# Patient Record
Sex: Male | Born: 1955 | Race: White | Hispanic: No | Smoking: Current every day smoker
Health system: Southern US, Community
[De-identification: ages and names within clinical notes are randomized; demographics above are authoritative.]

## PROBLEM LIST (undated history)

## (undated) DIAGNOSIS — F329 Major depressive disorder, single episode, unspecified: Secondary | ICD-10-CM

## (undated) DIAGNOSIS — I10 Essential (primary) hypertension: Secondary | ICD-10-CM

## (undated) DIAGNOSIS — F32A Depression, unspecified: Secondary | ICD-10-CM

## (undated) DIAGNOSIS — F419 Anxiety disorder, unspecified: Secondary | ICD-10-CM

## (undated) HISTORY — PX: APPENDECTOMY: SHX54

---

## 2018-04-30 ENCOUNTER — Encounter: Payer: Self-pay | Admitting: Internal Medicine

## 2018-04-30 ENCOUNTER — Inpatient Hospital Stay
Admission: EM | Admit: 2018-04-30 | Discharge: 2018-05-01 | DRG: 202 | Disposition: A | Discharge status: Home / Self Care | Attending: Internal Medicine | Admitting: Internal Medicine

## 2018-04-30 ENCOUNTER — Inpatient Hospital Stay

## 2018-04-30 ENCOUNTER — Emergency Department

## 2018-04-30 ENCOUNTER — Other Ambulatory Visit: Payer: Self-pay

## 2018-04-30 DIAGNOSIS — Z791 Long term (current) use of non-steroidal anti-inflammatories (NSAID): Secondary | ICD-10-CM | POA: Diagnosis not present

## 2018-04-30 DIAGNOSIS — A419 Sepsis, unspecified organism: Secondary | ICD-10-CM | POA: Diagnosis present

## 2018-04-30 DIAGNOSIS — Z23 Encounter for immunization: Secondary | ICD-10-CM | POA: Diagnosis not present

## 2018-04-30 DIAGNOSIS — Z79899 Other long term (current) drug therapy: Secondary | ICD-10-CM | POA: Diagnosis not present

## 2018-04-30 DIAGNOSIS — Z716 Tobacco abuse counseling: Secondary | ICD-10-CM | POA: Diagnosis not present

## 2018-04-30 DIAGNOSIS — K760 Fatty (change of) liver, not elsewhere classified: Secondary | ICD-10-CM | POA: Diagnosis present

## 2018-04-30 DIAGNOSIS — R6520 Severe sepsis without septic shock: Secondary | ICD-10-CM

## 2018-04-30 DIAGNOSIS — G4700 Insomnia, unspecified: Secondary | ICD-10-CM | POA: Diagnosis not present

## 2018-04-30 DIAGNOSIS — E869 Volume depletion, unspecified: Secondary | ICD-10-CM | POA: Diagnosis present

## 2018-04-30 DIAGNOSIS — J209 Acute bronchitis, unspecified: Principal | ICD-10-CM | POA: Diagnosis present

## 2018-04-30 DIAGNOSIS — R739 Hyperglycemia, unspecified: Secondary | ICD-10-CM | POA: Diagnosis present

## 2018-04-30 DIAGNOSIS — I248 Other forms of acute ischemic heart disease: Secondary | ICD-10-CM | POA: Diagnosis present

## 2018-04-30 DIAGNOSIS — E878 Other disorders of electrolyte and fluid balance, not elsewhere classified: Secondary | ICD-10-CM | POA: Diagnosis present

## 2018-04-30 DIAGNOSIS — E876 Hypokalemia: Secondary | ICD-10-CM | POA: Diagnosis not present

## 2018-04-30 DIAGNOSIS — J189 Pneumonia, unspecified organism: Secondary | ICD-10-CM

## 2018-04-30 DIAGNOSIS — F17210 Nicotine dependence, cigarettes, uncomplicated: Secondary | ICD-10-CM | POA: Diagnosis not present

## 2018-04-30 DIAGNOSIS — G7281 Critical illness myopathy: Secondary | ICD-10-CM

## 2018-04-30 LAB — COMPREHENSIVE METABOLIC PANEL
ALT: 60 U/L — ABNORMAL HIGH (ref 0–44)
AST: 46 U/L — ABNORMAL HIGH (ref 15–41)
Albumin: 4 g/dL (ref 3.5–5.0)
Alkaline Phosphatase: 61 U/L (ref 38–126)
Anion gap: 9 (ref 5–15)
BUN: 24 mg/dL — ABNORMAL HIGH (ref 6–20)
CO2: 20 mmol/L — ABNORMAL LOW (ref 22–32)
Calcium: 8.9 mg/dL (ref 8.9–10.3)
Chloride: 108 mmol/L (ref 98–111)
Creatinine, Ser: 0.84 mg/dL (ref 0.61–1.24)
GFR calc Af Amer: >60 mL/min (ref >60)
GFR calc non Af Amer: >60 mL/min (ref >60)
Glucose, Bld: 147 mg/dL — ABNORMAL HIGH (ref 70–99)
Potassium: 3.1 mmol/L — ABNORMAL LOW (ref 3.5–5.1)
SODIUM: 137 mmol/L (ref 135–145)
Total Bilirubin: 1 mg/dL (ref 0.3–1.2)
Total Protein: 6.6 g/dL (ref 6.5–8.1)

## 2018-04-30 LAB — CBC WITH DIFFERENTIAL/PLATELET
Abs Immature Granulocytes: 0.01 10*3/uL (ref 0.00–0.07)
Basophils Absolute: 0 10*3/uL (ref 0.0–0.1)
Basophils Relative: 1 %
Eosinophils Absolute: 0 10*3/uL (ref 0.0–0.5)
Eosinophils Relative: 0 %
HCT: 40.9 % (ref 39.0–52.0)
HEMOGLOBIN: 14.3 g/dL (ref 13.0–17.0)
Immature Granulocytes: 0 %
Lymphocytes Relative: 9 %
Lymphs Abs: 0.3 10*3/uL — ABNORMAL LOW (ref 0.7–4.0)
MCH: 30.2 pg (ref 26.0–34.0)
MCHC: 35 g/dL (ref 30.0–36.0)
MCV: 86.5 fL (ref 80.0–100.0)
Monocytes Absolute: 0 10*3/uL — ABNORMAL LOW (ref 0.1–1.0)
Monocytes Relative: 1 %
Neutro Abs: 3 10*3/uL (ref 1.7–7.7)
Neutrophils Relative %: 89 %
Platelets: 153 10*3/uL (ref 150–400)
RBC: 4.73 MIL/uL (ref 4.22–5.81)
RDW: 14.2 % (ref 11.5–15.5)
WBC: 3.4 10*3/uL — ABNORMAL LOW (ref 4.0–10.5)
nRBC: 0 % (ref 0.0–0.2)

## 2018-04-30 LAB — INFLUENZA PANEL BY PCR (TYPE A & B)
INFLBPCR: NEGATIVE
Influenza A By PCR: NEGATIVE

## 2018-04-30 LAB — LACTIC ACID, PLASMA: Lactic Acid, Venous: 1.6 mmol/L (ref 0.5–1.9)

## 2018-04-30 LAB — URINALYSIS, COMPLETE (UACMP) WITH MICROSCOPIC
Bacteria, UA: NONE SEEN
Bilirubin Urine: NEGATIVE
Glucose, UA: NEGATIVE mg/dL
Ketones, ur: 5 mg/dL — AB
Leukocytes, UA: NEGATIVE
Nitrite: NEGATIVE
Protein, ur: 100 mg/dL — AB
SPECIFIC GRAVITY, URINE: 1.021 (ref 1.005–1.030)
pH: 5 (ref 5.0–8.0)

## 2018-04-30 LAB — TROPONIN I
Troponin I: 0.13 ng/mL (ref <0.03)
Troponin I: 0.62 ng/mL (ref <0.03)
Troponin I: 0.59 ng/mL (ref <0.03)
Troponin I: 0.54 ng/mL (ref <0.03)
Troponin I: 0.39 ng/mL (ref <0.03)

## 2018-04-30 LAB — PHOSPHORUS: Phosphorus: 1.3 mg/dL — ABNORMAL LOW (ref 2.5–4.6)

## 2018-04-30 LAB — PROTIME-INR
INR: 1.21
PROTHROMBIN TIME: 15.2 s (ref 11.4–15.2)

## 2018-04-30 LAB — MAGNESIUM: Magnesium: 1.6 mg/dL — ABNORMAL LOW (ref 1.7–2.4)

## 2018-04-30 LAB — PROCALCITONIN: Procalcitonin: 20.57 ng/mL

## 2018-04-30 MED ORDER — MORPHINE SULFATE (PF) 4 MG/ML IV SOLN
4.0000 mg | INTRAVENOUS | Status: DC | PRN
  Administered 2018-04-30: 4 mg via INTRAVENOUS
  Filled 2018-04-30: qty 1

## 2018-04-30 MED ORDER — IBUPROFEN 600 MG PO TABS: 600.0000 mg | ORAL_TABLET | Freq: Once | ORAL | Status: DC

## 2018-04-30 MED ORDER — POTASSIUM CHLORIDE CRYS ER 20 MEQ PO TBCR
EXTENDED_RELEASE_TABLET | ORAL | Status: AC
  Filled 2018-04-30: qty 2

## 2018-04-30 MED ORDER — POTASSIUM CHLORIDE CRYS ER 20 MEQ PO TBCR
80.0000 meq | EXTENDED_RELEASE_TABLET | Freq: Once | ORAL | Status: AC
  Administered 2018-04-30: 80 meq via ORAL
  Filled 2018-04-30: qty 4

## 2018-04-30 MED ORDER — MAGNESIUM SULFATE 2 GM/50ML IV SOLN
2.0000 g | Freq: Once | INTRAVENOUS | Status: AC
  Administered 2018-04-30: 2 g via INTRAVENOUS
  Filled 2018-04-30: qty 50

## 2018-04-30 MED ORDER — LACTATED RINGERS IV SOLN
INTRAVENOUS | Status: AC
  Administered 2018-04-30 (×2): via INTRAVENOUS

## 2018-04-30 MED ORDER — MAGNESIUM SULFATE 2 GM/50ML IV SOLN
2.0000 g | Freq: Once | INTRAVENOUS | Status: AC
  Administered 2018-04-30: 2 g via INTRAVENOUS

## 2018-04-30 MED ORDER — POTASSIUM CHLORIDE CRYS ER 20 MEQ PO TBCR: 40.0000 meq | EXTENDED_RELEASE_TABLET | Freq: Once | ORAL | Status: DC

## 2018-04-30 MED ORDER — VANCOMYCIN HCL IN DEXTROSE 1-5 GM/200ML-% IV SOLN
1000.0000 mg | Freq: Two times a day (BID) | INTRAVENOUS | Status: DC
  Filled 2018-04-30: qty 200

## 2018-04-30 MED ORDER — ONDANSETRON HCL 4 MG/2ML IJ SOLN: 4.0000 mg | Freq: Four times a day (QID) | INTRAMUSCULAR | Status: DC | PRN

## 2018-04-30 MED ORDER — VANCOMYCIN HCL IN DEXTROSE 1-5 GM/200ML-% IV SOLN
1000.0000 mg | Freq: Once | INTRAVENOUS | Status: AC
  Administered 2018-04-30: 1000 mg via INTRAVENOUS
  Filled 2018-04-30 (×2): qty 200

## 2018-04-30 MED ORDER — ACETAMINOPHEN 500 MG PO TABS
1000.0000 mg | ORAL_TABLET | Freq: Once | ORAL | Status: AC
  Administered 2018-04-30: 1000 mg via ORAL
  Filled 2018-04-30: qty 2

## 2018-04-30 MED ORDER — BISACODYL 5 MG PO TBEC: 5.0000 mg | DELAYED_RELEASE_TABLET | Freq: Every day | ORAL | Status: DC | PRN

## 2018-04-30 MED ORDER — MORPHINE SULFATE (PF) 2 MG/ML IV SOLN
2.0000 mg | INTRAVENOUS | Status: DC | PRN
  Administered 2018-04-30 (×2): 2 mg via INTRAVENOUS
  Filled 2018-04-30 (×2): qty 1

## 2018-04-30 MED ORDER — SODIUM CHLORIDE 0.9 % IV BOLUS
1000.0000 mL | Freq: Once | INTRAVENOUS | Status: AC
  Administered 2018-04-30: 1000 mL via INTRAVENOUS

## 2018-04-30 MED ORDER — MAGNESIUM SULFATE 2 GM/50ML IV SOLN
2.0000 g | Freq: Once | INTRAVENOUS | Status: DC
  Filled 2018-04-30: qty 50

## 2018-04-30 MED ORDER — VANCOMYCIN HCL 10 G IV SOLR
1250.0000 mg | Freq: Two times a day (BID) | INTRAVENOUS | Status: DC
  Filled 2018-04-30 (×2): qty 1250

## 2018-04-30 MED ORDER — POTASSIUM PHOSPHATES 15 MMOLE/5ML IV SOLN
20.0000 mmol | Freq: Once | INTRAVENOUS | Status: AC
  Administered 2018-04-30: 20 mmol via INTRAVENOUS
  Filled 2018-04-30: qty 6.67

## 2018-04-30 MED ORDER — ACETAMINOPHEN 325 MG PO TABS
650.0000 mg | ORAL_TABLET | Freq: Four times a day (QID) | ORAL | Status: DC | PRN
  Administered 2018-04-30: 650 mg via ORAL
  Filled 2018-04-30: qty 2

## 2018-04-30 MED ORDER — METRONIDAZOLE IN NACL 5-0.79 MG/ML-% IV SOLN
500.0000 mg | Freq: Three times a day (TID) | INTRAVENOUS | Status: DC
  Administered 2018-04-30 – 2018-05-01 (×4): 500 mg via INTRAVENOUS
  Filled 2018-04-30 (×7): qty 100

## 2018-04-30 MED ORDER — LACTATED RINGERS IV BOLUS
1000.0000 mL | Freq: Once | INTRAVENOUS | Status: AC
  Administered 2018-04-30: 1000 mL via INTRAVENOUS

## 2018-04-30 MED ORDER — SODIUM CHLORIDE 0.9 % IV SOLN
2.0000 g | Freq: Three times a day (TID) | INTRAVENOUS | Status: DC
  Administered 2018-04-30 – 2018-05-01 (×3): 2 g via INTRAVENOUS
  Filled 2018-04-30 (×6): qty 2

## 2018-04-30 MED ORDER — ZOLPIDEM TARTRATE 5 MG PO TABS
5.0000 mg | ORAL_TABLET | Freq: Every evening | ORAL | Status: DC | PRN
  Administered 2018-04-30: 5 mg via ORAL
  Filled 2018-04-30: qty 1

## 2018-04-30 MED ORDER — ENOXAPARIN SODIUM 40 MG/0.4ML ~~LOC~~ SOLN
40.0000 mg | SUBCUTANEOUS | Status: DC
  Administered 2018-04-30 – 2018-05-01 (×2): 40 mg via SUBCUTANEOUS
  Filled 2018-04-30 (×2): qty 0.4

## 2018-04-30 MED ORDER — INFLUENZA VAC SPLIT QUAD 0.5 ML IM SUSY
0.5000 mL | PREFILLED_SYRINGE | INTRAMUSCULAR | Status: AC
  Administered 2018-05-01: 0.5 mL via INTRAMUSCULAR
  Filled 2018-04-30: qty 0.5

## 2018-04-30 MED ORDER — IBUPROFEN 400 MG PO TABS
800.0000 mg | ORAL_TABLET | Freq: Four times a day (QID) | ORAL | Status: DC | PRN
  Administered 2018-04-30 – 2018-05-01 (×3): 800 mg via ORAL
  Filled 2018-04-30 (×2): qty 2

## 2018-04-30 MED ORDER — ASPIRIN 81 MG PO CHEW
81.0000 mg | CHEWABLE_TABLET | Freq: Every day | ORAL | Status: DC
  Administered 2018-04-30 – 2018-05-01 (×2): 81 mg via ORAL
  Filled 2018-04-30 (×2): qty 1

## 2018-04-30 MED ORDER — IOHEXOL 300 MG/ML  SOLN
100.0000 mL | Freq: Once | INTRAMUSCULAR | Status: AC | PRN
  Administered 2018-04-30: 100 mL via INTRAVENOUS

## 2018-04-30 MED ORDER — SENNOSIDES-DOCUSATE SODIUM 8.6-50 MG PO TABS: 1.0000 | ORAL_TABLET | Freq: Every evening | ORAL | Status: DC | PRN

## 2018-04-30 MED ORDER — SODIUM CHLORIDE 0.9 % IV SOLN
2.0000 g | Freq: Once | INTRAVENOUS | Status: AC
  Administered 2018-04-30: 2 g via INTRAVENOUS
  Filled 2018-04-30: qty 2

## 2018-04-30 NOTE — H&P (Signed)
[employer] [place] [facility]   PATIENT NAME: Ricardo Roberts    MR#:  [id number]  DATE OF BIRTH:  May 09, 1956  DATE OF ADMISSION:  04/30/2018  PRIMARY CARE PHYSICIAN: Patient, No Pcp Per   REQUESTING/REFERRING PHYSICIAN: Merrily Brittle, MD  CHIEF COMPLAINT:   Chief Complaint  Patient presents with  . Fever  . Influenza    HISTORY OF PRESENT ILLNESS:  Ricardo Roberts  is a 62 y.o. male with a known history of tobacco use D/O p/w rigors, FUO, suspected sepsis. Pt is an excellent historian. He does not have a PCP and does not have any known medical history. He has been smoking 1ppd or less for ~66yrs. He does not take any prescription medications. He tells me that he had a cold/URI ~16mo ago, which lasted for ~1.5-2wks, and then improved. He states he had a persistent residual cough, but otherwise felt better, up until Friday 04/28/2018. He states he went to work as usual on Friday, and then went home in the afternoon, at which time he noticed body aches/myalgias throughout his body (most pronounced in his B/L thighs) and diffuse joint aches (most pronounced in fingers, knees, shoulders, neck). He states @~2300PM, he developed shaking chills. He endorses frontal headache and insomnia. He states his rigors stopped while he was in bed, but he developed subjective fever, diaphoresis and night sweats. He endorses fatigue/malaise and generalized weakness throughout the day on Saturday (04/29/2018). He states he again had rigors, subjective fever and diaphoresis @~2300PM. He states that his arms were shaking so badly that he was unable to take Advil out of the bottle without spilling it everywhere. He called EMS, and says his teeth were chattering, to the extent to which the EMS operator had difficulty obtaining his location/information. Pt endorses non-productive cough. He denies N/V/D, CP/SOB, AP. He endorses burning over the bladder and in the penis after he finished urinating, but  denies frequency, urgency, nocturia, hematuria, incontinence, hesitancy, dribbling, etc. He endorses multiple sick contacts (URI symptoms) at work. He denies recent travel. He has not been camping. 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 He did not receive flu or pneumonia vaccines. T 102.6 F, HR 136, RR 22, WBC 3.4, SIRS (+). BP stable, (-) shock. Lactate 1.6, however PCT 20.57. Flu (-). CXR (-). Presentation concerning for occult bacterial infxn, bacteremia. Infectious source not identified as yet. CT C/A/P pending. Started on broad-spectrum ABx (Cefepime + Vancomycin + Flagyl) in ED. Cx pending.  PAST MEDICAL HISTORY:  History reviewed. No pertinent past medical history.  PAST SURGICAL HISTORY:  History reviewed. No pertinent surgical history.  SOCIAL HISTORY:   Social History   Tobacco Use  . Smoking status: Not on file  Substance Use Topics  . Alcohol use: Not on file    FAMILY HISTORY:  History reviewed. No pertinent family history.  DRUG ALLERGIES:  No Known Allergies  REVIEW OF SYSTEMS:   Review of Systems  Constitutional: Positive for chills, diaphoresis, fever and malaise/fatigue. Negative for weight loss.  HENT: Negative for congestion, ear pain, hearing loss, nosebleeds, sinus pain, sore throat and tinnitus.   Eyes: Negative for blurred vision, double vision and photophobia.  Respiratory: Positive for cough. Negative for hemoptysis, sputum production, shortness of breath and wheezing.   Cardiovascular: Negative for chest pain, palpitations, orthopnea, claudication, leg swelling and PND.  Gastrointestinal: Negative for abdominal pain, blood in stool, constipation, diarrhea, heartburn, melena, nausea and vomiting.  Genitourinary: Negative for dysuria,  flank pain, frequency, hematuria and urgency.  Musculoskeletal: Positive for joint pain, myalgias and neck pain. Negative for back pain and falls.  Skin: Negative for itching and rash.    Neurological: Positive for weakness and headaches. Negative for dizziness, tingling, tremors, sensory change, speech change, focal weakness, seizures and loss of consciousness.  Psychiatric/Behavioral: Negative for depression and memory loss. The patient has insomnia. The patient is not nervous/anxious.    MEDICATIONS AT HOME:   Prior to Admission medications   Medication Sig Start Date End Date Taking? Authorizing Provider  acetaminophen (TYLENOL) 500 MG tablet Take 500 mg by mouth every 6 (six) hours as needed.   Yes [provider]  ibuprofen (ADVIL,MOTRIN) 200 MG tablet Take 600-800 mg by mouth every 6 (six) hours as needed.   Yes [provider]  Multiple Vitamin (MULTIVITAMIN WITH MINERALS) TABS tablet Take 1 tablet by mouth daily.   Yes [provider]      VITAL SIGNS:  Blood pressure 134/66, pulse (!) 136, temperature (!) 102.6 F (39.2 C), temperature source Oral, resp. rate (!) 22, height 5\' 9"  (1.753 m), weight 88.9 kg, SpO2 94 %.  PHYSICAL EXAMINATION:  Physical Exam  Constitutional: He is oriented to person, place, and time. He appears well-developed and well-nourished. He is active and cooperative. He appears toxic. No distress. He is not intubated.  HENT:  Head: Normocephalic and atraumatic.  Mouth/Throat: Oropharynx is clear and moist. No oropharyngeal exudate.  Eyes: Conjunctivae, EOM and lids are normal. No scleral icterus.  Neck: Neck supple. No JVD present. No thyromegaly present.  Cardiovascular: Regular rhythm, S1 normal, S2 normal and normal heart sounds.  No extrasystoles are present. Tachycardia present. Exam reveals no gallop, no S3, no S4, no distant heart sounds and no friction rub.  No murmur heard. Pulmonary/Chest: Effort normal. No accessory muscle usage or stridor. Tachypnea noted. No apnea and no bradypnea. He is not intubated. No respiratory distress. He has decreased breath sounds in the right upper field, the right middle  field, the right lower field, the left upper field, the left middle field and the left lower field. He has no wheezes. He has no rhonchi. He has rales in the right lower field and the left lower field.  Abdominal: Soft. He exhibits no distension. Bowel sounds are decreased. There is no tenderness. There is no rigidity, no rebound and no guarding.  Musculoskeletal: Normal range of motion. He exhibits no edema or tenderness.  Lymphadenopathy:    He has no cervical adenopathy.  Neurological: He is alert and oriented to person, place, and time. He is not disoriented.  Skin: Skin is warm and intact. No rash noted. He is diaphoretic. No erythema.  Psychiatric: He has a normal mood and affect. His speech is normal and behavior is normal. Judgment and thought content normal. Cognition and memory are normal.   Tachycardic, clammy/diaphoretic, toxic appearance. Bibasilar fine crackles. Abdomen soft, NT/ND. LABORATORY PANEL:   CBC Recent Labs  Lab 04/30/18 0102  WBC 3.4*  HGB 14.3  HCT 40.9  PLT 153   ------------------------------------------------------------------------------------------------------------------  Chemistries  Recent Labs  Lab 04/30/18 0102  NA 137  K 3.1*  CL 108  CO2 20*  GLUCOSE 147*  BUN 24*  CREATININE 0.84  CALCIUM 8.9  MG 1.6*  AST 46*  ALT 60*  ALKPHOS 61  BILITOT 1.0   ------------------------------------------------------------------------------------------------------------------  Cardiac Enzymes Recent Labs  Lab 04/30/18 0102  TROPONINI 0.13*   ------------------------------------------------------------------------------------------------------------------  RADIOLOGY:  [facility]  Result Date: 04/30/2018 CLINICAL DATA:  62 year old male with fever. EXAM: PORTABLE CHEST 1 VIEW COMPARISON:  None. FINDINGS: The heart size and mediastinal contours are within normal limits. Both lungs are clear. The visualized skeletal structures are  unremarkable. IMPRESSION: No active disease. Electronically Signed   By: Elgie Collard M.D.   On: 04/30/2018 01:17   IMPRESSION AND PLAN:   A/P: 42M rigors, FUO, SIRS (+), elevated PCT, suspected sepsis. Hypokalemia, hyperglycemia, insensible losses/intravascular volume depletion, hypophosphatemia, hypomagnesemia, transaminasemia, troponin elevation, leukopenia (w/o neutropenia). Myalgias, joint aches, headache, insomnia. -Rigors, FUO, SIRS (+), leukopenia (w/o neutropenia), elevated PCT, suspected sepsis: p/w cough, myalgias, joint aches, fever, chills, rigors, diaphoresis, night sweats, headache, insomnia, fatigue/malaise, generalized weakness (as per HPI). T 102.6 F, HR 136, RR 22, WBC 3.4 (ANC 3000); SIRS (+). BP stable, (-) shock. Lactate 1.6, however PCT 20.57. Flu (-). CXR (-). Presentation concerning for occult bacterial infxn, bacteremia. Infectious source not identified as yet. CT C/A/P pending. Started on broad-spectrum ABx (Cefepime + Vancomycin + Flagyl) in ED. IVF. BCx, sputum Cx/gram, UStrep + ULeg Ag pending. Tele, continuous cardiac monitoring, pulse ox. -Hypokalemia, hypomagnesemia, hypophosphatemia: Endorses good PO intake. K+ 3.1. Mag 1.6. Phos 1.3. Replete and monitor. -Insensible losses/intravascular volume depletion: Febrile, diaphoretic. BUN/Cr ratio 24/0.84 = ~28.6. IVF. -Transaminasemia: Likely 2/2 sepsis, intravascular volume depletion. (-) AP/N/V/D. Abdomen soft, NT/ND. Imaging pending. -Troponin elevation: Trop-I 0.13. Likely 2/2 tachycardia, demand in setting of sepsis. Trend. ZOX09. Tele, continuous cardiac monitoring. Denies CP/SOB. -Myalgias, joint aches, headache: Symptomatic mgmt, pain ctrl. -Insomnia: Ambien PRN. -No home meds -FEN/GI: Regular diet. -DVT PPx: Lovenox. -Code status: Full code. -Disposition: Admission, > 2 midnights.   All the records are reviewed and case discussed with ED provider. Management plans discussed with the patient, family and they  are in agreement.  CODE STATUS: Full code.  TOTAL TIME TAKING CARE OF THIS PATIENT: 75 minutes.    Barbaraann Rondo M.D on 04/30/2018 at 2:55 AM  Between 7am to 6pm - Pager - [phone]  After 6pm go to [email or web address] - Social research officer, government  [facility] [facility]  Office  [phone]  CC: Primary care physician; Patient, No Pcp Per   Note: This dictation was prepared with Dragon dictation along with smaller phrase technology. Any transcriptional errors that result from this process are unintentional.

## 2018-04-30 NOTE — Progress Notes (Signed)
Pharmacy Antibiotic Note  Ricardo FloridaMark Roberts is a 62 y.o. male admitted on 04/30/2018 with sepsis.  Pharmacy has been consulted for vanc/cefepime dosing. Patient received vanc 1g, cefepime 2g, and flagyl 500 mg IV x 1  Plan: Will continue w/ vanc 1.25g IV q12h w/ 6 hour stack  Will draw trough 12/09 @ 2300 prior to 4th dose. Will continue cefepime 2g IV q8h  Ke 0.0903 (overestimate) T1/2 calculated 8 hrs - will start off w/ q12h  Goal trough 15 - 20 mcg/mL  Height: (P) 5\' 9"  (175.3 cm) Weight: (P) 199 lb 11.2 oz (90.6 kg) IBW/kg (Calculated) : (P) 70.7  Temp (24hrs), Avg:100.2 F (37.9 C), Min:97.7 F (36.5 C), Max:102.6 F (39.2 C)  Recent Labs  Lab 04/30/18 0102  WBC 3.4*  CREATININE 0.84  LATICACIDVEN 1.6    Estimated Creatinine Clearance: 103.2 mL/min (by C-G formula based on SCr of 0.84 mg/dL).    No Known Allergies  Thank you for allowing pharmacy to be a part of this patient's care.  Thomasene Rippleavid Jakylan Ron, PharmD, BCPS Clinical Pharmacist 04/30/2018

## 2018-04-30 NOTE — ED Notes (Signed)
Admitting MD at bedside.

## 2018-04-30 NOTE — Progress Notes (Signed)
Patient on the floor with no pain. Only meds given were flagyl and potassium pills. Admission partially done patient wanted some rest.

## 2018-04-30 NOTE — Progress Notes (Signed)
CODE SEPSIS - PHARMACY COMMUNICATION  **Broad Spectrum Antibiotics should be administered within 1 hour of Sepsis diagnosis**  Time Code Sepsis Called/Page Received: n/a  Antibiotics Ordered: vanc/cefepime  Time of 1st antibiotic administration: 0157  Additional action taken by pharmacy:   If necessary, Name of Provider/Nurse Contacted:     Thomasene Rippleavid  Yessenia Maillet ,PharmD Clinical Pharmacist  04/30/2018  5:08 AM

## 2018-04-30 NOTE — ED Notes (Signed)
Patient to CT via stretcher.

## 2018-04-30 NOTE — Consult Note (Addendum)
Pharmacy Electrolyte Monitoring Consult:  Pharmacy consulted to assist in monitoring and replacing electrolytes in this 62 y.o. male with a known history of tobacco use D/O p/w rigors, FUO, suspected sepsis     Labs:   Potassium (mmol/L)  Date Value  04/30/2018 3.1 (L)   Magnesium (mg/dL)  Date Value  16/10/960412/12/2017 1.6 (L)   Phosphorus (mg/dL)  Date Value  54/09/811912/12/2017 1.3 (L)   Calcium (mg/dL)  Date Value  14/78/295612/12/2017 8.9   Albumin (g/dL)  Date Value  21/30/865712/12/2017 4.0    Assessment/Plan: Prior to labs being taken he was administered oral KCl 80 mEq and IV magnesium sulfate 2 grams. IV potassium phosphate 20 mmol was ordered but not yet administered  Give magnesium sulfate 2 grams IV once  Pharmacy will order labs for tomorrow morning and replace if needed  Thank you for allowing pharmacy to be a part of this patient's care.  Burnis Medinodney Countess Biebel, PharmD Clinical Pharmacist

## 2018-04-30 NOTE — ED Provider Notes (Signed)
[facility] Emergency Department Provider Note  ____________________________________________   First MD Initiated Contact with Patient 04/30/18 [phone]     (approximate)  I have reviewed the triage vital signs and the nursing notes.   HISTORY  Chief Complaint Fever and Influenza   HPI Ricardo Roberts is a 62 y.o. male who comes to the emergency department via EMS with roughly 48 hours of fevers and chills.  He has had a dry cough and malaise for the past week or so however 48 hours ago at 11 PM he began to shake and sweat uncontrollably in bed.  His symptoms resolved by the morning however tonight at 11 PM they acutely returned which prompted the visit.  He is a heavy cigarette smoker although carries no diagnosis of COPD.  He has not seen a doctor in "years".  He did not get a flu shot this year.  He does report some mild low back discomfort along with generalized malaise.  He has sharp upper chest pain worse when coughing.  No sore throat.  No ear pain.  No abdominal pain nausea or vomiting.  No dysuria.  No history of abdominal surgeries.  Nothing seems to make his symptoms better and they are clearly worsened at night.  He did take 800 mg of ibuprofen shortly prior to coming.    History reviewed. No pertinent past medical history.  Patient Active Problem List   Diagnosis Date Noted  . Sepsis (HCC) 04/30/2018    History reviewed. No pertinent surgical history.  Prior to Admission medications   Medication Sig Start Date End Date Taking? Authorizing Provider  acetaminophen (TYLENOL) 500 MG tablet Take 500 mg by mouth every 6 (six) hours as needed.   Yes [provider]  ibuprofen (ADVIL,MOTRIN) 200 MG tablet Take 600-800 mg by mouth every 6 (six) hours as needed.   Yes [provider]  Multiple Vitamin (MULTIVITAMIN WITH MINERALS) TABS tablet Take 1 tablet by mouth daily.   Yes [provider]    Allergies Patient has no known  allergies.  History reviewed. No pertinent family history.  Social History Social History   Tobacco Use  . Smoking status: Current Every Day Smoker    Packs/day: 1.00    Types: Cigarettes  . Smokeless tobacco: Never Used  Substance Use Topics  . Alcohol use: Yes    Comment: socially  . Drug use: Never    Review of Systems Constitutional: Positive or fevers and chills Eyes: No visual changes. ENT: No sore throat. Cardiovascular: Positive for chest pain. Respiratory: Positive for cough Gastrointestinal: No abdominal pain.  No nausea, no vomiting.  No diarrhea.  No constipation. Genitourinary: Negative for dysuria. Musculoskeletal: Positive for back pain. Skin: Negative for rash. Neurological: Negative for headaches, focal weakness or numbness.   ____________________________________________   PHYSICAL EXAM:  VITAL SIGNS: ED Triage Vitals  Enc Vitals Group     BP 04/30/18 0039 134/66     Pulse Rate 04/30/18 0039 (!) 136     Resp 04/30/18 0039 (!) 22     Temp 04/30/18 0039 (!) 102.6 F (39.2 C)     Temp Source 04/30/18 0039 Oral     SpO2 04/30/18 0039 94 %     Weight 04/30/18 0038 196 lb (88.9 kg)     Height 04/30/18 0038 5\' 9"  (1.753 m)     Head Circumference --      Peak Flow --      Pain Score 04/30/18 0041 4  Pain Loc --      Pain Edu? --      Excl. in GC? --     Constitutional: Alert and oriented x4 he has rigors and shaking chills Eyes: PERRL EOMI. midrange and brisk Head: Atraumatic. Nose: No congestion/rhinnorhea. Mouth/Throat: No trismus Neck: No stridor.  No meningismus Cardiovascular: Tachycardic rate, regular rhythm. Grossly normal heart sounds.  Good peripheral circulation. Respiratory: Increased respiratory effort.  Using mild accessory muscles although lungs are actually quite clear and moving good air Gastrointestinal: Soft nontender Musculoskeletal: No lower extremity edema   Neurologic:  Normal speech and language. No gross focal  neurologic deficits are appreciated. Skin:  Skin is warm, dry and intact. No rash noted. Psychiatric: Mood and affect are normal. Speech and behavior are normal.    ____________________________________________   DIFFERENTIAL includes but not limited to  Influenza, influenza-like illness, sepsis, pneumonia, bacteremia, urinary tract infection ____________________________________________   LABS (all labs ordered are listed, but only abnormal results are displayed)  Labs Reviewed  COMPREHENSIVE METABOLIC PANEL - Abnormal; Notable for the following components:      Result Value   Potassium 3.1 (*)    CO2 20 (*)    Glucose, Bld 147 (*)    BUN 24 (*)    AST 46 (*)    ALT 60 (*)    All other components within normal limits  CBC WITH DIFFERENTIAL/PLATELET - Abnormal; Notable for the following components:   WBC 3.4 (*)    Lymphs Abs 0.3 (*)    Monocytes Absolute 0.0 (*)    All other components within normal limits  TROPONIN I - Abnormal; Notable for the following components:   Troponin I 0.13 (*)    All other components within normal limits  URINALYSIS, COMPLETE (UACMP) WITH MICROSCOPIC - Abnormal; Notable for the following components:   Color, Urine YELLOW (*)    APPearance HAZY (*)    Hgb urine dipstick MODERATE (*)    Ketones, ur 5 (*)    Protein, ur 100 (*)    All other components within normal limits  MAGNESIUM - Abnormal; Notable for the following components:   Magnesium 1.6 (*)    All other components within normal limits  PHOSPHORUS - Abnormal; Notable for the following components:   Phosphorus 1.3 (*)    All other components within normal limits  TROPONIN I - Abnormal; Notable for the following components:   Troponin I 0.62 (*)    All other components within normal limits  TROPONIN I - Abnormal; Notable for the following components:   Troponin I 0.59 (*)    All other components within normal limits  CULTURE, BLOOD (ROUTINE X 2)  CULTURE, BLOOD (ROUTINE X 2)    EXPECTORATED SPUTUM ASSESSMENT W REFEX TO RESP CULTURE  GRAM STAIN  RESPIRATORY PANEL BY PCR  INFLUENZA PANEL BY PCR (TYPE A & B)  PROCALCITONIN  LACTIC ACID, PLASMA  PROTIME-INR  TROPONIN I  HIV ANTIBODY (ROUTINE TESTING W REFLEX)  STREP PNEUMONIAE URINARY ANTIGEN  LEGIONELLA PNEUMOPHILA SEROGP 1 UR AG  TROPONIN I  TROPONIN I    Lab work reviewed by me shows low white count raising concern for sepsis.  Elevated procalcitonin likewise concerning for sepsis.  Elevated troponin likely secondary to demand not primary myocardial ischemia __________________________________________  EKG   ____________________________________________  RADIOLOGY  Chest x-ray reviewed by me with no acute disease ____________________________________________   PROCEDURES  Procedure(s) performed: no  .Critical Care Performed by: Merrily Brittle, MD Authorized by: Merrily Brittle,  MD   Critical care provider statement:    Critical care time (minutes):  35   Critical care time was exclusive of:  Separately billable procedures and treating other patients   Critical care was necessary to treat or prevent imminent or life-threatening deterioration of the following conditions:  Sepsis   Critical care was time spent personally by me on the following activities:  Development of treatment plan with patient or surrogate, discussions with consultants, evaluation of patient's response to treatment, examination of patient, obtaining history from patient or surrogate, ordering and performing treatments and interventions, ordering and review of laboratory studies, ordering and review of radiographic studies, pulse oximetry, re-evaluation of patient's condition and review of old charts    Critical Care performed: Yes  ____________________________________________   INITIAL IMPRESSION / ASSESSMENT AND PLAN / ED COURSE  Pertinent labs & imaging results that were available during my care of the patient were  reviewed by me and considered in my medical decision making (see chart for details).   As part of my medical decision making, I reviewed the following data within the electronic MEDICAL RECORD NUMBER History obtained from family if available, nursing notes, old chart and ekg, as well as notes from prior ED visits.  The patient comes to the emergency department with 1 week of cough and 48 hours of shaking chills.  He is febrile to 102.6 degrees orally here and quite tachycardic and actually hypoxic to the low to mid 90s.  He has no known history of COPD but does have a 40-pack-year history of smoking.  His lungs are clear at this time.  Differential is quite broad although in flu season this could be influenza versus sepsis and bacteremia etc.  I will hold off on antibiotics until her rapid flu test comes back however if it is negative he will require broad-spectrum antibiotics and inpatient admission.  Tylenol and IV fluids in the meantime.    The patient's flu swab is negative raising concern for sepsis.  I have begun him on broad-spectrum antibiotics according to her antibiogram and will continue aggressive fluid resuscitation.  He is slightly hypokalemic so we will give him magnesium as well as oral potassium repletion.  At this point he requires inpatient admission for IV antibiotics and continued work-up and evaluation for his severe sepsis.  I appreciate his slightly elevated troponin but EKG is nonischemic.  I discussed with the hospitalist Dr. Helayne Seminole who has graciously agreed to admit the patient to his service.  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     Merrily Brittle, MD 04/30/18 1004

## 2018-04-30 NOTE — ED Notes (Signed)
Patient will go to admission room after he has CTs.

## 2018-04-30 NOTE — Progress Notes (Signed)
Patient here with fevers.  CT scan was negative.  Continue cefepime.  Discontinue vancomycin.  Elevated troponin due to demand ischemia and is now trending downward.  Potassium will be repleted.  Agree with admitting MD plan.

## 2018-04-30 NOTE — ED Triage Notes (Signed)
Patient to [facility] from home via EMS.  Per EMS patient with fever and chills that began tonight at 11 pm.  Reports same symptoms last pm that resolved on their own.  EMS treatment:  IV fluids 500 ml via 20 g angiocath to left hand; vs - hr 160's down to 145 after fluid bolus, BP 144/80, pulse oxi 98% on room air.

## 2018-05-01 ENCOUNTER — Inpatient Hospital Stay
Admit: 2018-05-01 | Discharge: 2018-05-01 | Disposition: A | Discharge status: Home / Self Care | Attending: Internal Medicine | Admitting: Internal Medicine

## 2018-05-01 ENCOUNTER — Inpatient Hospital Stay

## 2018-05-01 LAB — ECHOCARDIOGRAM COMPLETE
HEIGHTINCHES: 69 in
WEIGHTICAEL: 3195.2 [oz_av]

## 2018-05-01 LAB — BASIC METABOLIC PANEL
Anion gap: 7 (ref 5–15)
BUN: 15 mg/dL (ref 6–20)
CHLORIDE: 108 mmol/L (ref 98–111)
CO2: 22 mmol/L (ref 22–32)
Calcium: 8.3 mg/dL — ABNORMAL LOW (ref 8.9–10.3)
Creatinine, Ser: 0.75 mg/dL (ref 0.61–1.24)
GFR calc Af Amer: >60 mL/min (ref >60)
GFR calc non Af Amer: >60 mL/min (ref >60)
Glucose, Bld: 111 mg/dL — ABNORMAL HIGH (ref 70–99)
Potassium: 3.6 mmol/L (ref 3.5–5.1)
Sodium: 137 mmol/L (ref 135–145)

## 2018-05-01 LAB — MAGNESIUM: Magnesium: 2.2 mg/dL (ref 1.7–2.4)

## 2018-05-01 LAB — PHOSPHORUS: Phosphorus: 2 mg/dL — ABNORMAL LOW (ref 2.5–4.6)

## 2018-05-01 LAB — RESPIRATORY PANEL BY PCR
Adenovirus: NOT DETECTED
Bordetella pertussis: NOT DETECTED
Chlamydophila pneumoniae: NOT DETECTED
Coronavirus 229E: NOT DETECTED
Coronavirus HKU1: NOT DETECTED
Coronavirus NL63: NOT DETECTED
Coronavirus OC43: NOT DETECTED
Influenza A: NOT DETECTED
Influenza B: NOT DETECTED
Metapneumovirus: NOT DETECTED
Mycoplasma pneumoniae: NOT DETECTED
Parainfluenza Virus 1: NOT DETECTED
Parainfluenza Virus 2: NOT DETECTED
Parainfluenza Virus 3: NOT DETECTED
Parainfluenza Virus 4: NOT DETECTED
RESPIRATORY SYNCYTIAL VIRUS-RVPPCR: NOT DETECTED
Rhinovirus / Enterovirus: NOT DETECTED

## 2018-05-01 LAB — STREP PNEUMONIAE URINARY ANTIGEN: Strep Pneumo Urinary Antigen: NEGATIVE

## 2018-05-01 MED ORDER — LEVOFLOXACIN 750 MG PO TABS: 750.0000 mg | ORAL_TABLET | Freq: Every day | ORAL | 0 refills | Status: AC

## 2018-05-01 MED ORDER — SODIUM CHLORIDE 0.9 % IV SOLN
INTRAVENOUS | Status: DC | PRN
  Administered 2018-05-01: 500 mL via INTRAVENOUS

## 2018-05-01 MED ORDER — LEVOFLOXACIN 750 MG PO TABS: 750.0000 mg | ORAL_TABLET | Freq: Every day | ORAL | Status: DC

## 2018-05-01 NOTE — Progress Notes (Signed)
*  PRELIMINARY RESULTS* Echocardiogram 2D Echocardiogram has been performed.  Cristela BlueHege, Gurkirat Basher 05/01/2018, 10:14 AM

## 2018-05-01 NOTE — Discharge Summary (Signed)
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  SECONDARY DIAGNOSIS:  History reviewed. No pertinent past medical history.  HOSPITAL COURSE:   61 year old male with tobacco dependence who presents with  Fever.  1.  Fever: I am suspecting the fever is due to acute bronchitis.  CT chest was negative for pneumonia.  I repeated chest x-ray which does show acute bronchitis.  UA was negative as well.  He was afebrile during his hospital stay.  He will be discharged on oral Levaquin.  2.Tobacco dependence: Patient is encouraged to quit smoking. Counseling was provided for 4 minutes. He will find a PCP and would like to take Chantix.  3.  Electrolyte abnormalities: These were repleted                                                             4.  Elevated troponin: This is due to demand ischemia.  Patient had no EKG changes or chest pain.  Echocardiogram showed normal ejection fraction without wall motion abnormalities. Was ruled out for ACS.  DISCHARGE CONDITIONS AND DIET:  STAble for discharge on regular diet  CONSULTS OBTAINED:  Treatment Team:  Barbaraann Rondo, MD  DRUG ALLERGIES:  No Known Allergies  DISCHARGE MEDICATIONS:   Allergies as of 05/01/2018   No Known Allergies     Medication List    TAKE these medications   acetaminophen 500 MG tablet Commonly known as:  TYLENOL Take 500 mg by mouth every 6 (six) hours as needed.   ibuprofen 200 MG tablet Commonly known as:  ADVIL,MOTRIN Take 600-800 mg by mouth every 6 (six) hours as needed.    levofloxacin 750 MG tablet Commonly known as:  LEVAQUIN Take 1 tablet (750 mg total) by mouth daily for 4 days.   multivitamin with minerals Tabs tablet Take 1 tablet by mouth daily.         Today   CHIEF COMPLAINT:   NO Fevers overnight.  No chest pain or shortness of breath.   VITAL SIGNS:  Blood pressure 121/82, pulse 83, temperature 98.2 F (36.8 C), temperature source Oral, resp. rate 16, height 5\' 9"  (1.753 m), weight 90.6 kg, SpO2 96 %.   REVIEW OF SYSTEMS:  Review of Systems  Constitutional: Negative.  Negative for chills, fever and malaise/fatigue.  HENT: Negative.  Negative for ear discharge, ear pain, hearing loss, nosebleeds and sore throat.   Eyes: Negative.  Negative for blurred vision and pain.  Respiratory: Negative.  Negative for cough, hemoptysis, shortness of breath and wheezing.   Cardiovascular: Negative.  Negative for chest pain, palpitations and leg swelling.  Gastrointestinal: Negative.  Negative for abdominal pain, blood in stool, diarrhea, nausea and vomiting.  Genitourinary: Negative.  Negative for dysuria.  Musculoskeletal: Negative.  Negative for back pain.  Skin: Negative.   Neurological: Negative for dizziness, tremors, speech change, focal weakness, seizures and  headaches.  Endo/Heme/Allergies: Negative.  Does not bruise/bleed easily.  Psychiatric/Behavioral: Negative.  Negative for depression, hallucinations and suicidal ideas.     PHYSICAL EXAMINATION:  GENERAL:  62 y.o.-year-old patient lying in the bed with no acute distress.  NECK:  Supple, no jugular venous distention. No thyroid enlargement, no tenderness.  LUNGS: Normal breath sounds bilaterally, no wheezing, rales,rhonchi  No use of accessory muscles of respiration.  CARDIOVASCULAR: S1, S2 normal. No murmurs, rubs, or gallops.  ABDOMEN: Soft, non-tender, non-distended. Bowel sounds present. No organomegaly or mass.  EXTREMITIES: No pedal edema, cyanosis, or clubbing.   PSYCHIATRIC: The patient is alert and oriented x 3.  SKIN: No obvious rash, lesion, or ulcer.   DATA REVIEW:   CBC Recent Labs  Lab 04/30/18 0102  WBC 3.4*  HGB 14.3  HCT 40.9  PLT 153    Chemistries  Recent Labs  Lab 04/30/18 0102 05/01/18 0341  NA 137 137  K 3.1* 3.6  CL 108 108  CO2 20* 22  GLUCOSE 147* 111*  BUN 24* 15  CREATININE 0.84 0.75  CALCIUM 8.9 8.3*  MG 1.6* 2.2  AST 46*  --   ALT 60*  --   ALKPHOS 61  --   BILITOT 1.0  --     Cardiac Enzymes Recent Labs  Lab 04/30/18 0851 04/30/18 1311 04/30/18 2027  TROPONINI 0.59* 0.54* 0.39*    Microbiology Results  @[email or web address] @  RADIOLOGY:  Dg Chest 1 View  Result Date: 05/01/2018 CLINICAL DATA:  Pneumonia EXAM: CHEST  1 VIEW COMPARISON:  04/30/2018 FINDINGS: Heart size is normal. Mediastinal shadows are normal. There may be mild bronchial thickening but there is no infiltrate, collapse or effusion. No significant bone finding. IMPRESSION: Possible bronchitis. No consolidation or collapse. Electronically Signed   By: Paulina Fusi M.D.   On: 05/01/2018 09:29   Ct Chest W Contrast  Result Date: 04/30/2018 CLINICAL DATA:  62 year old male with fever of unknown origin. EXAM: CT CHEST, ABDOMEN, AND PELVIS WITH CONTRAST TECHNIQUE: Multidetector CT imaging of the chest, abdomen and pelvis was performed following the standard protocol during bolus administration of intravenous contrast. CONTRAST:  OMNIPAQUE IOHEXOL 300 MG/ML  SOLN COMPARISON:  None. FINDINGS: CT CHEST FINDINGS Cardiovascular: There is no cardiomegaly or pericardial effusion. The thoracic aorta is unremarkable. The origins of the great vessels of the aortic arch are patent. The central pulmonary arteries are unremarkable. Mediastinum/Nodes: There is no hilar or mediastinal adenopathy. Esophagus and the thyroid gland are grossly unremarkable. No mediastinal fluid collection. Lungs/Pleura: There is no focal consolidation, pleural effusion, or  pneumothorax. Mild subpleural interstitial coarsening and probable honeycombing. The central airways are patent. Musculoskeletal: No acute osseous pathology. CT ABDOMEN PELVIS FINDINGS No intra-abdominal free air or free fluid. Hepatobiliary: There is diffuse fatty infiltration of the liver. No intrahepatic biliary ductal dilatation. The gallbladder is unremarkable. Pancreas: Unremarkable. No pancreatic ductal dilatation or surrounding inflammatory changes. Spleen: Normal in size without focal abnormality. Adrenals/Urinary Tract: The adrenal glands are unremarkable. Subcentimeter partially exophytic hypodense lesion from the anterior aspect of the inferior pole of the left kidney is too small to characterize. There is no hydronephrosis on either side. There is symmetric enhancement and excretion of contrast by both kidneys. The visualized ureters and urinary bladder appear unremarkable. Stomach/Bowel: There is sigmoid diverticulosis with muscular hypertrophy. No active inflammatory changes. There is no bowel obstruction or active inflammation. The appendix is not visualized with certainty. No inflammatory changes identified in the right lower quadrant. Vascular/Lymphatic: Mild aortoiliac  atherosclerotic disease. No portal venous gas. There is no adenopathy. Reproductive: The prostate and seminal vesicles are grossly unremarkable. No pelvic mass. Other: Small fat containing umbilical hernia. Musculoskeletal: Degenerative changes of the spine and lower lumbar facet arthropathy. No acute osseous pathology. IMPRESSION: 1. No acute intrathoracic, abdominal, or pelvic pathology. 2. Fatty liver. 3. Sigmoid diverticulosis. No bowel obstruction or active inflammation. Electronically Signed   By: Elgie Collard M.D.   On: 04/30/2018 04:23   Ct Abdomen Pelvis W Contrast  Result Date: 04/30/2018 CLINICAL DATA:  62 year old male with fever of unknown origin. EXAM: CT CHEST, ABDOMEN, AND PELVIS WITH CONTRAST TECHNIQUE:  Multidetector CT imaging of the chest, abdomen and pelvis was performed following the standard protocol during bolus administration of intravenous contrast. CONTRAST:  OMNIPAQUE IOHEXOL 300 MG/ML  SOLN COMPARISON:  None. FINDINGS: CT CHEST FINDINGS Cardiovascular: There is no cardiomegaly or pericardial effusion. The thoracic aorta is unremarkable. The origins of the great vessels of the aortic arch are patent. The central pulmonary arteries are unremarkable. Mediastinum/Nodes: There is no hilar or mediastinal adenopathy. Esophagus and the thyroid gland are grossly unremarkable. No mediastinal fluid collection. Lungs/Pleura: There is no focal consolidation, pleural effusion, or pneumothorax. Mild subpleural interstitial coarsening and probable honeycombing. The central airways are patent. Musculoskeletal: No acute osseous pathology. CT ABDOMEN PELVIS FINDINGS No intra-abdominal free air or free fluid. Hepatobiliary: There is diffuse fatty infiltration of the liver. No intrahepatic biliary ductal dilatation. The gallbladder is unremarkable. Pancreas: Unremarkable. No pancreatic ductal dilatation or surrounding inflammatory changes. Spleen: Normal in size without focal abnormality. Adrenals/Urinary Tract: The adrenal glands are unremarkable. Subcentimeter partially exophytic hypodense lesion from the anterior aspect of the inferior pole of the left kidney is too small to characterize. There is no hydronephrosis on either side. There is symmetric enhancement and excretion of contrast by both kidneys. The visualized ureters and urinary bladder appear unremarkable. Stomach/Bowel: There is sigmoid diverticulosis with muscular hypertrophy. No active inflammatory changes. There is no bowel obstruction or active inflammation. The appendix is not visualized with certainty. No inflammatory changes identified in the right lower quadrant. Vascular/Lymphatic: Mild aortoiliac atherosclerotic disease. No portal venous gas.  There is no adenopathy. Reproductive: The prostate and seminal vesicles are grossly unremarkable. No pelvic mass. Other: Small fat containing umbilical hernia. Musculoskeletal: Degenerative changes of the spine and lower lumbar facet arthropathy. No acute osseous pathology. IMPRESSION: 1. No acute intrathoracic, abdominal, or pelvic pathology. 2. Fatty liver. 3. Sigmoid diverticulosis. No bowel obstruction or active inflammation. Electronically Signed   By: Elgie Collard M.D.   On: 04/30/2018 04:23   Dg Chest Port 1 View  Result Date: 04/30/2018 CLINICAL DATA:  62 year old male with fever. EXAM: PORTABLE CHEST 1 VIEW COMPARISON:  None. FINDINGS: The heart size and mediastinal contours are within normal limits. Both lungs are clear. The visualized skeletal structures are unremarkable. IMPRESSION: No active disease. Electronically Signed   By: Elgie Collard M.D.   On: 04/30/2018 01:17      Allergies as of 05/01/2018   No Known Allergies     Medication List    TAKE these medications   acetaminophen 500 MG tablet Commonly known as:  TYLENOL Take 500 mg by mouth every 6 (six) hours as needed.   ibuprofen 200 MG tablet Commonly known as:  ADVIL,MOTRIN Take 600-800 mg by mouth every 6 (six) hours as needed.   levofloxacin 750 MG tablet Commonly known as:  LEVAQUIN Take 1 tablet (750 mg total) by mouth daily for 4  days.   multivitamin with minerals Tabs tablet Take 1 tablet by mouth daily.         Management plans discussed with the patient and HE is in agreement. Stable for discharge HOME  Patient should follow up with HE WILL FIND pcp HE TELLS ME  CODE STATUS:     Code Status Orders  (From admission, onward)         Start     Ordered   04/30/18 0357  Full code  Continuous     04/30/18 0356        Code Status History    This patient has a current code status but no historical code status.      TOTAL TIME TAKING CARE OF THIS PATIENT: 38 minutes.    Note:  This dictation was prepared with Dragon dictation along with smaller phrase technology. Any transcriptional errors that result from this process are unintentional.  Argie Applegate M.D on 05/01/2018 at 10:31 AM  Between 7am to 6pm - Pager - [phone] After 6pm go to [email or web address] - password [email or web address]  [facility]  Office  [phone]  CC: Primary care physician; Patient, No Pcp Per

## 2018-05-01 NOTE — Plan of Care (Signed)
  Problem: Education: Goal: Knowledge of General Education information will improve Description: Including pain rating scale, medication(s)/side effects and non-pharmacologic comfort measures Outcome: Progressing   Problem: Activity: Goal: Risk for activity intolerance will decrease Outcome: Progressing   Problem: Pain Managment: Goal: General experience of comfort will improve Outcome: Progressing   

## 2018-05-01 NOTE — Progress Notes (Signed)
Discharged to home with son.  To receive 4 days of Levaquin.

## 2018-05-02 LAB — LEGIONELLA PNEUMOPHILA SEROGP 1 UR AG: L. pneumophila Serogp 1 Ur Ag: NEGATIVE

## 2018-05-02 LAB — HIV ANTIBODY (ROUTINE TESTING W REFLEX): HIV SCREEN 4TH GENERATION: NONREACTIVE

## 2018-05-03 ENCOUNTER — Ambulatory Visit
Admission: EM | Admit: 2018-05-03 | Discharge: 2018-05-03 | Disposition: A | Discharge status: Home / Self Care | Attending: Family Medicine | Admitting: Family Medicine

## 2018-05-03 ENCOUNTER — Other Ambulatory Visit: Payer: Self-pay

## 2018-05-03 ENCOUNTER — Encounter: Payer: Self-pay | Admitting: Emergency Medicine

## 2018-05-03 DIAGNOSIS — I10 Essential (primary) hypertension: Secondary | ICD-10-CM

## 2018-05-03 DIAGNOSIS — R000 Tachycardia, unspecified: Secondary | ICD-10-CM

## 2018-05-03 DIAGNOSIS — F17210 Nicotine dependence, cigarettes, uncomplicated: Secondary | ICD-10-CM | POA: Diagnosis not present

## 2018-05-03 DIAGNOSIS — R05 Cough: Secondary | ICD-10-CM | POA: Diagnosis present

## 2018-05-03 DIAGNOSIS — R51 Headache: Secondary | ICD-10-CM

## 2018-05-03 DIAGNOSIS — R519 Headache, unspecified: Secondary | ICD-10-CM

## 2018-05-03 DIAGNOSIS — Z791 Long term (current) use of non-steroidal anti-inflammatories (NSAID): Secondary | ICD-10-CM | POA: Insufficient documentation

## 2018-05-03 DIAGNOSIS — A419 Sepsis, unspecified organism: Secondary | ICD-10-CM | POA: Diagnosis not present

## 2018-05-03 DIAGNOSIS — Z79899 Other long term (current) drug therapy: Secondary | ICD-10-CM | POA: Insufficient documentation

## 2018-05-03 DIAGNOSIS — I248 Other forms of acute ischemic heart disease: Secondary | ICD-10-CM | POA: Diagnosis not present

## 2018-05-03 LAB — COMPREHENSIVE METABOLIC PANEL
ALBUMIN: 4.4 g/dL (ref 3.5–5.0)
ALK PHOS: 141 U/L — AB (ref 38–126)
ALT: 193 U/L — ABNORMAL HIGH (ref 0–44)
AST: 176 U/L — ABNORMAL HIGH (ref 15–41)
Anion gap: 12 (ref 5–15)
BUN: 16 mg/dL (ref 6–20)
CALCIUM: 9.1 mg/dL (ref 8.9–10.3)
CO2: 21 mmol/L — ABNORMAL LOW (ref 22–32)
Chloride: 103 mmol/L (ref 98–111)
Creatinine, Ser: 0.7 mg/dL (ref 0.61–1.24)
GFR calc Af Amer: >60 mL/min (ref >60)
GFR calc non Af Amer: >60 mL/min (ref >60)
Glucose, Bld: 114 mg/dL — ABNORMAL HIGH (ref 70–99)
Potassium: 3.6 mmol/L (ref 3.5–5.1)
Sodium: 136 mmol/L (ref 135–145)
Total Bilirubin: 0.9 mg/dL (ref 0.3–1.2)
Total Protein: 7.9 g/dL (ref 6.5–8.1)

## 2018-05-03 LAB — CBC WITH DIFFERENTIAL/PLATELET
ABS IMMATURE GRANULOCYTES: 0.15 10*3/uL — AB (ref 0.00–0.07)
Basophils Absolute: 0 10*3/uL (ref 0.0–0.1)
Basophils Relative: 0 %
Eosinophils Absolute: 0.1 10*3/uL (ref 0.0–0.5)
Eosinophils Relative: 1 %
HCT: 42.2 % (ref 39.0–52.0)
Hemoglobin: 15.1 g/dL (ref 13.0–17.0)
Immature Granulocytes: 2 %
Lymphocytes Relative: 8 %
Lymphs Abs: 0.6 10*3/uL — ABNORMAL LOW (ref 0.7–4.0)
MCH: 30.1 pg (ref 26.0–34.0)
MCHC: 35.8 g/dL (ref 30.0–36.0)
MCV: 84.2 fL (ref 80.0–100.0)
MONO ABS: 0.9 10*3/uL (ref 0.1–1.0)
Monocytes Relative: 12 %
Neutro Abs: 5.8 10*3/uL (ref 1.7–7.7)
Neutrophils Relative %: 77 %
Platelets: 203 10*3/uL (ref 150–400)
RBC: 5.01 MIL/uL (ref 4.22–5.81)
RDW: 13.8 % (ref 11.5–15.5)
WBC: 7.6 10*3/uL (ref 4.0–10.5)
nRBC: 0 % (ref 0.0–0.2)

## 2018-05-03 MED ORDER — BUTALBITAL-APAP-CAFFEINE 50-325-40 MG PO TABS: 1.0000 | ORAL_TABLET | Freq: Four times a day (QID) | ORAL | 0 refills | Status: DC | PRN

## 2018-05-03 MED ORDER — CARVEDILOL 6.25 MG PO TABS: 6.2500 mg | ORAL_TABLET | Freq: Two times a day (BID) | ORAL | 1 refills | Status: DC

## 2018-05-03 NOTE — ED Provider Notes (Signed)
[facility]    CSN: [id number] Arrival date & time: 05/03/18  1425  History   Chief Complaint Chief Complaint  Patient presents with  . Headache  . Cough   HPI  62 year old male presents with the above complaints.  Patient recently admitted for sepsis.  Discharged on 12/29.  Etiology was unclear.  Had negative CTchest, abdomen and pelvis.  No lactic acidosis.  He did have elevated troponin which was attributed to demand ischemia.  Did not have an EKG done.  Echo is still pending.  Negative for strep pneumo and Legionella.  Flu negative.  Blood cultures have been negative.  Patient was discharged on Levaquin.  Patient states that he is feeling improved but now has a cough.  Additionally, he has had worsening headache.  He states that he has regular headaches but this is worse than his normal.  Located on the right side just at the hairline.  He only takes Motrin with improvement.  Motrin has not relieved his pain.  He used a nicotine patch this morning and felt flushed so he removed it.  He took his blood pressure given the headache and the flushing and his blood pressure was elevated at 180/111.  Patient states that he feels anxious.  He has had no further fever.  No other associated symptoms.  No other complaints.  Patient has not been able to get primary care follow-up yet.  History reviewed and updated as below.  PMH: Tobacco abuse  Patient Active Problem List   Diagnosis Date Noted  . Sepsis ([place]) 04/30/2018   Past Surgical History:  Procedure Laterality Date  . APPENDECTOMY     Home Medications    Prior to Admission medications   Medication Sig Start Date End Date Taking? Authorizing Provider  ibuprofen (ADVIL,MOTRIN) 200 MG tablet Take 600-800 mg by mouth every 6 (six) hours as needed.   Yes [provider]  levofloxacin (LEVAQUIN) 750 MG tablet Take 1 tablet (750 mg total) by mouth daily for 4 days. 05/01/18 05/05/18 Yes Mody, Ulice Bold, MD    acetaminophen (TYLENOL) 500 MG tablet Take 500 mg by mouth every 6 (six) hours as needed.    [provider]  butalbital-acetaminophen-caffeine (FIORICET, ESGIC) [phone] MG tablet Take 1 tablet by mouth every 6 (six) hours as needed for headache. 05/03/18   Coral Spikes, DO  carvedilol (COREG) 6.25 MG tablet Take 1 tablet (6.25 mg total) by mouth 2 (two) times daily with a meal. 05/03/18   Coral Spikes, DO  Multiple Vitamin (MULTIVITAMIN WITH MINERALS) TABS tablet Take 1 tablet by mouth daily.    [provider]   Social History Social History   Tobacco Use  . Smoking status: Current Every Day Smoker    Packs/day: 1.00    Types: Cigarettes  . Smokeless tobacco: Never Used  Substance Use Topics  . Alcohol use: Yes    Comment: socially  . Drug use: Never     Allergies   Patient has no known allergies.   Review of Systems Review of Systems  Constitutional: Positive for fatigue.  Respiratory: Positive for cough.   Neurological: Positive for headaches.   Physical Exam Triage Vital Signs ED Triage Vitals  Enc Vitals Group     BP 05/03/18 1440 (S) (!) 161/103     Pulse Rate 05/03/18 1440 (S) (!) 135     Resp 05/03/18 1440 16     Temp 05/03/18 1440 98.4 F (36.9 C)  Temp Source 05/03/18 1440 Oral     SpO2 05/03/18 1440 99 %     Weight 05/03/18 1437 200 lb (90.7 kg)     Height 05/03/18 1437 '5\' 9"'  (1.753 m)     Head Circumference --      Peak Flow --      Pain Score 05/03/18 1437 8     Pain Loc --      Pain Edu? --      Excl. in [place]? --    Updated Vital Signs BP (S) (!) 161/103 (BP Location: Left Arm)   Pulse (S) (!) 135   Temp 98.4 F (36.9 C) (Oral)   Resp 16   Ht '5\' 9"'  (1.753 m)   Wt 90.7 kg   SpO2 99%   BMI 29.53 kg/m   Visual Acuity Right Eye Distance:   Left Eye Distance:   Bilateral Distance:    Right Eye Near:   Left Eye Near:    Bilateral Near:     Physical Exam  Constitutional: He is oriented to person, place, and time.  He appears well-developed. No distress.  HENT:  Head: Normocephalic and atraumatic.  Mouth/Throat: Oropharynx is clear and moist.  Eyes: Conjunctivae are normal. Right eye exhibits no discharge. Left eye exhibits no discharge.  Cardiovascular: Regular rhythm. Tachycardia present.  Pulmonary/Chest: Effort normal and breath sounds normal.  Abdominal: Soft. He exhibits no distension. There is no tenderness.  Neurological: He is alert and oriented to person, place, and time.  Psychiatric: He has a normal mood and affect. His behavior is normal.  Nursing note and vitals reviewed.  UC Treatments / Results  Labs (all labs ordered are listed, but only abnormal results are displayed) Labs Reviewed  CBC WITH DIFFERENTIAL/PLATELET - Abnormal; Notable for the following components:      Result Value   Lymphs Abs 0.6 (*)    Abs Immature Granulocytes 0.15 (*)    All other components within normal limits  COMPREHENSIVE METABOLIC PANEL - Abnormal; Notable for the following components:   CO2 21 (*)    Glucose, Bld 114 (*)    AST 176 (*)    ALT 193 (*)    Alkaline Phosphatase 141 (*)    All other components within normal limits    EKG Interpretation: Sinus tachycardia at the rate of 121.  No ST or T wave changes.  Q waves noted in lead III. Normal intervals.  Radiology No results found.  Procedures Procedures (including critical care time)  Medications Ordered in UC Medications - No data to display  Initial Impression / Assessment and Plan / UC Course  I have reviewed the triage vital signs and the nursing notes.  Pertinent labs & imaging results that were available during my care of the patient were reviewed by me and considered in my medical decision making (see chart for details).    62 year old male presents with a headache and cough.  Patient hypertensive and tachycardic.  EKG unrevealing.  Placing patient on Coreg for hypertension and elevated heart rate.  Fioricet for headache (if  needed; likely to improve with lowering BP).  I attempted to get his echocardiogram read but was unable to do so.  Repeat laboratory studies revealed elevated LFTs as well as alk phos.  Suspect viral etiology given negative CT findings.  I advised him that he likely needs cardiology follow-up.  We will attempt to arrange.  Final Clinical Impressions(s) / UC Diagnoses   Final diagnoses:  Hypertension, unspecified type  Acute nonintractable headache, unspecified headache type  Tachycardia     Discharge Instructions     Medication as prescribed.  Check BP 1-2 times daily.  I will try to get you a cardiology appt (their office will call).  Take care  Dr. Lacinda Axon    ED Prescriptions    Medication Sig Dispense Auth. Provider   carvedilol (COREG) 6.25 MG tablet Take 1 tablet (6.25 mg total) by mouth 2 (two) times daily with a meal. 60 tablet Laketha Leopard G, DO   butalbital-acetaminophen-caffeine (FIORICET, ESGIC) 50-325-40 MG tablet Take 1 tablet by mouth every 6 (six) hours as needed for headache. 20 tablet Coral Spikes, DO     Controlled Substance Prescriptions [place] Controlled Substance Registry consulted? Not Applicable   Coral Spikes, DO 05/03/18 1631

## 2018-05-03 NOTE — ED Triage Notes (Signed)
Patient c/o HAs and cough that started yesterday.  Patient states that his blood pressure has been elevated.  Patient states that he was [facility] on [facility].  Patient trying to establish with PCP appointment on Dec. 24.

## 2018-05-03 NOTE — Discharge Instructions (Signed)
Medication as prescribed.  Check BP 1-2 times daily.  I will try to get you a cardiology appt (their office will call).  Take care  Dr. Adriana Simasook

## 2018-05-04 LAB — BLOOD CULTURE ID PANEL (REFLEXED)
ACINETOBACTER BAUMANNII: NOT DETECTED
Candida albicans: NOT DETECTED
Candida glabrata: NOT DETECTED
Candida krusei: NOT DETECTED
Candida parapsilosis: NOT DETECTED
Candida tropicalis: NOT DETECTED
Enterobacter cloacae complex: NOT DETECTED
Enterobacteriaceae species: NOT DETECTED
Enterococcus species: NOT DETECTED
Escherichia coli: NOT DETECTED
Haemophilus influenzae: NOT DETECTED
Klebsiella oxytoca: NOT DETECTED
Klebsiella pneumoniae: NOT DETECTED
Listeria monocytogenes: NOT DETECTED
Neisseria meningitidis: NOT DETECTED
PSEUDOMONAS AERUGINOSA: NOT DETECTED
Proteus species: NOT DETECTED
SERRATIA MARCESCENS: NOT DETECTED
Staphylococcus aureus (BCID): NOT DETECTED
Staphylococcus species: NOT DETECTED
Streptococcus agalactiae: NOT DETECTED
Streptococcus pneumoniae: NOT DETECTED
Streptococcus pyogenes: NOT DETECTED
Streptococcus species: NOT DETECTED

## 2018-05-05 LAB — CULTURE, BLOOD (ROUTINE X 2): Culture: NO GROWTH

## 2018-05-09 ENCOUNTER — Telehealth: Payer: Self-pay | Admitting: Emergency Medicine

## 2018-05-09 LAB — CULTURE, BLOOD (ROUTINE X 2)

## 2018-05-09 NOTE — Telephone Encounter (Signed)
Dr. Adriana Simasook was notified today at 1314 of corrected lab result on patient's blood culture.

## 2018-06-27 ENCOUNTER — Other Ambulatory Visit (HOSPITAL_COMMUNITY): Payer: Self-pay | Admitting: Family Medicine

## 2018-06-27 ENCOUNTER — Other Ambulatory Visit: Payer: Self-pay | Admitting: Family Medicine

## 2018-06-27 DIAGNOSIS — R519 Headache, unspecified: Secondary | ICD-10-CM

## 2018-06-27 DIAGNOSIS — R51 Headache: Principal | ICD-10-CM

## 2018-07-03 ENCOUNTER — Encounter (INDEPENDENT_AMBULATORY_CARE_PROVIDER_SITE_OTHER): Payer: Self-pay

## 2018-07-03 ENCOUNTER — Ambulatory Visit
Admission: RE | Admit: 2018-07-03 | Discharge: 2018-07-03 | Disposition: A | Discharge status: Home / Self Care | Source: Ambulatory Visit | Attending: Family Medicine | Admitting: Family Medicine

## 2018-07-03 DIAGNOSIS — G8929 Other chronic pain: Secondary | ICD-10-CM

## 2018-07-03 DIAGNOSIS — R51 Headache: Secondary | ICD-10-CM | POA: Insufficient documentation

## 2018-09-12 ENCOUNTER — Other Ambulatory Visit: Payer: Self-pay

## 2018-09-12 ENCOUNTER — Encounter: Payer: Self-pay | Admitting: Emergency Medicine

## 2018-09-12 ENCOUNTER — Ambulatory Visit
Admission: EM | Admit: 2018-09-12 | Discharge: 2018-09-12 | Disposition: A | Discharge status: Home / Self Care | Payer: Worker's Compensation | Attending: Family Medicine | Admitting: Family Medicine

## 2018-09-12 DIAGNOSIS — S61011A Laceration without foreign body of right thumb without damage to nail, initial encounter: Secondary | ICD-10-CM | POA: Diagnosis not present

## 2018-09-12 DIAGNOSIS — W260XXA Contact with knife, initial encounter: Secondary | ICD-10-CM

## 2018-09-12 HISTORY — DX: Essential (primary) hypertension: I10

## 2018-09-12 HISTORY — DX: Anxiety disorder, unspecified: F41.9

## 2018-09-12 HISTORY — DX: Depression, unspecified: F32.A

## 2018-09-12 HISTORY — DX: Major depressive disorder, single episode, unspecified: F32.9

## 2018-09-12 NOTE — ED Triage Notes (Signed)
Patient c/o right thumb laceration on a box cutter this morning.

## 2018-09-12 NOTE — ED Provider Notes (Signed)
[facility]    CSN: [id number] Arrival date & time: 09/12/18  1028  History   Chief Complaint Chief Complaint  Patient presents with  . Laceration   HPI  63 year old male presents with a laceration to his right thumb.  Patient was using a box cutter this morning and inadvertently cut his right thumb.  Last tetanus was in January of this year.  He states that he has no pain currently.  Bleeding is well controlled.  No reports of foreign body.  Patient states that the blade was not dirty.  However, he states that it was not his normal blade.  He is otherwise feeling well.  No other associated symptoms.  No other complaints.  PMH, Surgical Hx, Family Hx, Social History reviewed and updated as below.  Past Medical History:  Diagnosis Date  . Anxiety   . Depression   . Hypertension    Past Surgical History:  Procedure Laterality Date  . APPENDECTOMY     Home Medications    Prior to Admission medications   Medication Sig Start Date End Date Taking? Authorizing Provider  carvedilol (COREG) 6.25 MG tablet Take 1 tablet (6.25 mg total) by mouth 2 (two) times daily with a meal. 05/03/18  Yes Garrie Elenes G, DO  carvedilol (COREG) 6.25 MG tablet Take by mouth. 06/28/18  Yes [provider]  CHANTIX STARTING MONTH PAK 0.5 MG X 11 & 1 MG X 42 tablet  08/16/18  Yes [provider]  escitalopram (LEXAPRO) 10 MG tablet Take by mouth. 06/23/18 09/21/18 Yes [provider]  lisinopril (ZESTRIL) 10 MG tablet TAKE 1 TABLET(10 MG) BY MOUTH TWICE DAILY 09/05/18  Yes [provider]  Multiple Vitamin (MULTIVITAMIN WITH MINERALS) TABS tablet Take 1 tablet by mouth daily.   Yes [provider]  acetaminophen (TYLENOL) 500 MG tablet Take 500 mg by mouth every 6 (six) hours as needed.    [provider]  butalbital-acetaminophen-caffeine (FIORICET, ESGIC) [phone] MG tablet Take 1 tablet by mouth every 6 (six) hours as needed for headache.  05/03/18   Tommie Samsook, Tela Kotecki G, DO  ibuprofen (ADVIL,MOTRIN) 200 MG tablet Take 600-800 mg by mouth every 6 (six) hours as needed.    [provider]   Social History Social History   Tobacco Use  . Smoking status: Current Every Day Smoker    Packs/day: 1.00    Types: Cigarettes  . Smokeless tobacco: Never Used  Substance Use Topics  . Alcohol use: Not Currently    Comment: socially  . Drug use: Never   Allergies   Patient has no known allergies.  Review of Systems Review of Systems  Constitutional: Negative.   Skin: Positive for wound.   Physical Exam Triage Vital Signs ED Triage Vitals  Enc Vitals Group     BP 09/12/18 1040 110/84     Pulse Rate 09/12/18 1040 76     Resp 09/12/18 1040 18     Temp 09/12/18 1040 98.6 F (37 C)     Temp Source 09/12/18 1040 Oral     SpO2 09/12/18 1040 100 %     Weight 09/12/18 1037 195 lb (88.5 kg)     Height 09/12/18 1037 5\' 9"  (1.753 m)     Head Circumference --      Peak Flow --      Pain Score 09/12/18 1037 0     Pain Loc --      Pain Edu? --  Excl. in GC? --    Updated Vital Signs BP 110/84 (BP Location: Left Arm)   Pulse 76   Temp 98.6 F (37 C) (Oral)   Resp 18   Ht 5\' 9"  (1.753 m)   Wt 88.5 kg   SpO2 100%   BMI 28.80 kg/m   Visual Acuity Right Eye Distance:   Left Eye Distance:   Bilateral Distance:    Right Eye Near:   Left Eye Near:    Bilateral Near:     Physical Exam Vitals signs and nursing note reviewed.  Constitutional:      General: He is not in acute distress.    Appearance: Normal appearance.  HENT:     Head: Normocephalic and atraumatic.  Eyes:     General:        Right eye: No discharge.        Left eye: No discharge.     Conjunctiva/sclera: Conjunctivae normal.  Cardiovascular:     Rate and Rhythm: Normal rate and regular rhythm.  Pulmonary:     Effort: Pulmonary effort is normal.     Breath sounds: Normal breath sounds.  Skin:    Comments: Right thumb -dorsum of the thumb  with a 2.5 cm linear laceration which is well approximated just lateral to the nailbed.  No nailbed involvement.  No foreign body.  Neurological:     Mental Status: He is alert.  Psychiatric:        Mood and Affect: Mood normal.        Behavior: Behavior normal.    UC Treatments / Results  Labs (all labs ordered are listed, but only abnormal results are displayed) Labs Reviewed - No data to display  EKG None  Radiology No results found.  Procedures Laceration Repair Date/Time: 09/12/2018 10:58 AM Performed by: Tommie Sams, DO Authorized by: Tommie Sams, DO   Consent:    Consent obtained:  Verbal   Consent given by:  Patient Anesthesia (see MAR for exact dosages):    Anesthesia method:  None Laceration details:    Location:  Finger   Finger location:  R thumb   Length (cm):  2.5 Repair type:    Repair type:  Simple Exploration:    Hemostasis achieved with:  Direct pressure   Contaminated: no   Treatment:    Area cleansed with:  Soap and water Skin repair:    Repair method:  Tissue adhesive (Dermabond) Approximation:    Approximation:  Close Post-procedure details:    Dressing:  Bulky dressing   Patient tolerance of procedure:  Tolerated well, no immediate complications   (including critical care time)  Medications Ordered in UC Medications - No data to display  Initial Impression / Assessment and Plan / UC Course  I have reviewed the triage vital signs and the nursing notes.  Pertinent labs & imaging results that were available during my care of the patient were reviewed by me and considered in my medical decision making (see chart for details).     63 year old male presents with a laceration to his right thumb.  Closed with Dermabond.  Workmen's Comp. form filled out.  Patient to return to work tomorrow.  Final Clinical Impressions(s) / UC Diagnoses   Final diagnoses:  Laceration of right thumb without foreign body without damage to nail, initial  encounter   Discharge Instructions   None    ED Prescriptions    None     Controlled Substance Prescriptions [place] Controlled  Substance Registry consulted? Not Applicable   Tommie Sams, DO 09/12/18 1100

## 2018-09-12 NOTE — ED Notes (Signed)
Collected UDS in clinic. No issues with collection. [facility]

## 2020-05-27 ENCOUNTER — Ambulatory Visit (INDEPENDENT_AMBULATORY_CARE_PROVIDER_SITE_OTHER): Admitting: Urology

## 2020-05-27 ENCOUNTER — Other Ambulatory Visit: Payer: Self-pay

## 2020-05-27 ENCOUNTER — Encounter: Payer: Self-pay | Admitting: Urology

## 2020-05-27 VITALS — BP 162/86 | HR 91 | Ht 69.0 in | Wt 185.0 lb

## 2020-05-27 DIAGNOSIS — N489 Disorder of penis, unspecified: Secondary | ICD-10-CM

## 2020-05-27 NOTE — Patient Instructions (Signed)
No sexual activity for 2 weeks, if worsening symptoms in 10-12 days call to change visit to in person

## 2020-05-27 NOTE — Progress Notes (Signed)
   05/27/20 2:24 PM   Ricardo Roberts 09/03/1955 [id number]  CC: Penile lesion  HPI: I saw Ricardo Roberts in urology clinic today for penile lesion.  Is a 65 year old male who was using a penile ring in mid November during sexual activity and describes an abrasion at the base of the glans when this was removed.  He reports that he is continued to be sexually active since that time, and it has not healed.  He has tried a number of things including topical steroid cream and Bactroban.  The Bactroban seem to help initially, but the lesions have persisted.  He denies any history of STDs, and denies any exposure or new partners.  He continues to be sexually active every 1 to 2 days, and this continues to irritate these areas.  He uses Viagra for erectile dysfunction with good results.  He denies any urinary symptoms or gross hematuria.   PMH: Past Medical History:  Diagnosis Date  . Anxiety   . Depression   . Hypertension     Surgical History: Past Surgical History:  Procedure Laterality Date  . APPENDECTOMY      Family History: Family History  Problem Relation Age of Onset  . Prostate cancer Neg Hx   . Bladder Cancer Neg Hx   . Kidney cancer Neg Hx     Social History:  reports that he has been smoking cigarettes. He has been smoking about 1.00 pack per day. He has never used smokeless tobacco. He reports previous alcohol use. He reports that he does not use drugs.  Physical Exam: BP (!) 162/86   Pulse 91   Ht 5\' 9"  (1.753 m)   Wt 185 lb (83.9 kg)   BMI 27.32 kg/m    Constitutional:  Alert and oriented, No acute distress. Cardiovascular: No clubbing, cyanosis, or edema. Respiratory: Normal respiratory effort, no increased work of breathing. GI: Abdomen is soft, nontender, nondistended, no abdominal masses GU: Circumcised phallus with patent meatus, testicles 20 cc and descended bilaterally, multiple small <1 cm patches of subtle erythema on the distal shaft and glans with some  irritation and dry skin.  No purulence, open vesicles, or induration.  Nontender.  Assessment & Plan:   65 year old male with penile abrasions from a penile ring injury that have been persistent over the last 6 weeks.  I suspect this is secondary to his ongoing sexual activity and unwillingness to abstain for more than 1 to 2 days at a time.  I recommended avoiding all sexual activity for 2 weeks and following up for symptom check, I suspect these will improve on their own, but if he has no significant improvement, and he may be worth trying a course of Mycolog cream for possible fungal infection.  I do not suspect these areas would need biopsy, but need to consider if nonhealing despite above strategies.  Avoid sexual activity for 2 weeks RTC 2 weeks symptom check, consider Mycolog if persistent lesions/symptoms  70, MD 05/27/2020  [facility] [address] [place], [place] [phone]

## 2020-05-30 ENCOUNTER — Other Ambulatory Visit: Payer: Self-pay

## 2020-05-30 DIAGNOSIS — N489 Disorder of penis, unspecified: Secondary | ICD-10-CM

## 2020-05-30 DIAGNOSIS — N481 Balanitis: Secondary | ICD-10-CM

## 2020-05-30 MED ORDER — NYSTATIN-TRIAMCINOLONE 100000-0.1 UNIT/GM-% EX OINT: 1.0000 "application " | TOPICAL_OINTMENT | Freq: Two times a day (BID) | CUTANEOUS | 0 refills | Status: AC

## 2020-06-03 MED ORDER — MUPIROCIN 2 % EX OINT: TOPICAL_OINTMENT | Freq: Two times a day (BID) | CUTANEOUS | 1 refills | Status: AC

## 2020-06-04 ENCOUNTER — Ambulatory Visit: Admitting: Urology

## 2020-06-10 ENCOUNTER — Telehealth: Admitting: Urology

## 2020-06-10 ENCOUNTER — Other Ambulatory Visit: Payer: Self-pay

## 2020-10-03 ENCOUNTER — Other Ambulatory Visit: Payer: Self-pay

## 2020-10-03 ENCOUNTER — Encounter: Payer: Self-pay | Admitting: Emergency Medicine

## 2020-10-03 ENCOUNTER — Ambulatory Visit
Admission: EM | Admit: 2020-10-03 | Discharge: 2020-10-03 | Disposition: A | Discharge status: Home / Self Care | Attending: Physician Assistant | Admitting: Physician Assistant

## 2020-10-03 DIAGNOSIS — H1032 Unspecified acute conjunctivitis, left eye: Secondary | ICD-10-CM | POA: Diagnosis not present

## 2020-10-03 MED ORDER — CIPROFLOXACIN HCL 0.3 % OP SOLN: OPHTHALMIC | 0 refills | Status: AC

## 2020-10-03 NOTE — ED Triage Notes (Addendum)
Pt states that yesterday he noticed redness, irritation, drainage, and slight blurriness in his left eye. Pt states that he woke up and tried to open his eye but it was hard to open due to dried up drainage

## 2020-10-03 NOTE — Discharge Instructions (Signed)
CONJUNCTIVITIS: Use medications as directed. Avoid itching eyes as it increases chances of spreading your infection . If you have any questions or concerns, please call or stop back here and we will be happy to help you at any time. If symptoms acutely worsen, follow up with our office immediately or go to the eye doctor/ER  °

## 2020-10-03 NOTE — ED Provider Notes (Signed)
[facility]    CSN: [id number] Arrival date & time: 10/03/20  2878      History   Chief Complaint Chief Complaint  Patient presents with  . Eye Drainage  . Conjunctivitis    HPI Ricardo Roberts is a 65 y.o. male presenting for redness, irritation, slight yellowish drainage with blurriness, and overall discomfort of the left eye since yesterday.  Patient states he felt a foreign body sensation yesterday but not today.  He says that this morning when he woke up his eyelid was crusted over and he had to pry it open.  He has applied redness relief eyedrops and says his symptoms are little bit better today than yesterday but he is concerned that he could have bacterial pinkeye and does not want to go to work if he is contagious to others.  He does not wear contact lenses.  He has no other symptoms to report today.  No other concerns.  HPI  Past Medical History:  Diagnosis Date  . Anxiety   . Depression   . Hypertension     Patient Active Problem List   Diagnosis Date Noted  . Sepsis (HCC) 04/30/2018    Past Surgical History:  Procedure Laterality Date  . APPENDECTOMY         Home Medications    Prior to Admission medications   Medication Sig Start Date End Date Taking? Authorizing Provider  ciprofloxacin (CILOXAN) 0.3 % ophthalmic solution Administer 1 drop, every 2 hours, while awake, for 2 days. Then 1 drop, every 4 hours, while awake, for the next 5 days. 10/03/20  Yes Eusebio Friendly B, PA-C  carvedilol (COREG) 6.25 MG tablet Take by mouth. 06/28/18   [provider]  escitalopram (LEXAPRO) 10 MG tablet Take by mouth. 06/23/18 09/21/18  [provider]  ibuprofen (ADVIL,MOTRIN) 200 MG tablet Take 600-800 mg by mouth every 6 (six) hours as needed.    [provider]  lisinopril (ZESTRIL) 10 MG tablet TAKE 1 TABLET(10 MG) BY MOUTH TWICE DAILY 09/05/18   [provider]  Multiple Vitamin (MULTIVITAMIN WITH MINERALS) TABS tablet Take 1  tablet by mouth daily.    [provider]  mupirocin ointment (BACTROBAN) 2 % Apply topically 2 (two) times daily. 06/03/20   Sondra Come, MD  nystatin-triamcinolone ointment [facility]) Apply 1 application topically 2 (two) times daily. 05/30/20   Sondra Come, MD  sildenafil (VIAGRA) 100 MG tablet Take by mouth. 05/01/20   [provider]    Family History Family History  Problem Relation Age of Onset  . Prostate cancer Neg Hx   . Bladder Cancer Neg Hx   . Kidney cancer Neg Hx     Social History Social History   Tobacco Use  . Smoking status: Current Every Day Smoker    Packs/day: 1.00    Types: Cigarettes  . Smokeless tobacco: Never Used  Vaping Use  . Vaping Use: Never used  Substance Use Topics  . Alcohol use: Not Currently    Comment: socially  . Drug use: Never     Allergies   Patient has no known allergies.   Review of Systems Review of Systems  Constitutional: Negative for fatigue and fever.  HENT: Negative for congestion, rhinorrhea and sore throat.   Eyes: Positive for discharge, redness, itching and visual disturbance. Negative for photophobia and pain.  Skin: Negative for rash.  Neurological: Negative for dizziness and headaches.     Physical Exam Triage Vital Signs ED  Triage Vitals  Enc Vitals Group     BP 10/03/20 0904 (!) 148/88     Pulse Rate 10/03/20 0904 75     Resp 10/03/20 0904 18     Temp 10/03/20 0904 98.5 F (36.9 C)     Temp Source 10/03/20 0904 Oral     SpO2 10/03/20 0904 100 %     Weight --      Height --      Head Circumference --      Peak Flow --      Pain Score 10/03/20 0901 0     Pain Loc --      Pain Edu? --      Excl. in GC? --    No data found.  Updated Vital Signs BP (!) 148/88 (BP Location: Left Arm)   Pulse 75   Temp 98.5 F (36.9 C) (Oral)   Resp 18   SpO2 100%    Physical Exam Vitals and nursing note reviewed.  Constitutional:      General: He is not in acute distress.     Appearance: Normal appearance. He is well-developed. He is not ill-appearing.  HENT:     Head: Normocephalic and atraumatic.     Nose: Nose normal.     Mouth/Throat:     Mouth: Mucous membranes are moist.     Pharynx: Oropharynx is clear.  Eyes:     General: Lids are normal. Lids are everted, no foreign bodies appreciated. Vision grossly intact. No scleral icterus.    Conjunctiva/sclera:     Left eye: Left conjunctiva is injected.  Cardiovascular:     Rate and Rhythm: Normal rate and regular rhythm.  Pulmonary:     Effort: Pulmonary effort is normal. No respiratory distress.     Breath sounds: Normal breath sounds.  Musculoskeletal:     Cervical back: Neck supple.  Skin:    General: Skin is warm and dry.  Neurological:     General: No focal deficit present.     Mental Status: He is alert. Mental status is at baseline.     Motor: No weakness.     Gait: Gait normal.  Psychiatric:        Mood and Affect: Mood normal.        Behavior: Behavior normal.        Thought Content: Thought content normal.      UC Treatments / Results  Labs (all labs ordered are listed, but only abnormal results are displayed) Labs Reviewed - No data to display  EKG   Radiology No results found.  Procedures Procedures (including critical care time)  Medications Ordered in UC Medications - No data to display  Initial Impression / Assessment and Plan / UC Course  I have reviewed the triage vital signs and the nursing notes.  Pertinent labs & imaging results that were available during my care of the patient were reviewed by me and considered in my medical decision making (see chart for details).   65 year old male presenting for redness and drainage to left eye.  Exam is consistent with conjunctivitis.  Suspect bacterial cause.  Treating at this time with Cipro eyedrops.  Advised he can also use over-the-counter redness relief drops.  Reviewed when to return and when to go to ED.  Work note  given.   Final Clinical Impressions(s) / UC Diagnoses   Final diagnoses:  Acute bacterial conjunctivitis of left eye     Discharge Instructions  CONJUNCTIVITIS: Use medications as directed. Avoid itching eyes as it increases chances of spreading your infection . If you have any questions or concerns, please call or stop back here and we will be happy to help you at any time. If symptoms acutely worsen, follow up with our office immediately or go to the eye doctor/ER     ED Prescriptions    Medication Sig Dispense Auth. Provider   ciprofloxacin (CILOXAN) 0.3 % ophthalmic solution Administer 1 drop, every 2 hours, while awake, for 2 days. Then 1 drop, every 4 hours, while awake, for the next 5 days. 5 mL Shirlee Latch, PA-C     PDMP not reviewed this encounter.   Shirlee Latch, PA-C 10/03/20 [phone]

## 2020-10-27 ENCOUNTER — Telehealth: Payer: Self-pay | Admitting: *Deleted

## 2020-10-27 ENCOUNTER — Encounter: Payer: Self-pay | Admitting: *Deleted

## 2020-10-27 NOTE — Telephone Encounter (Signed)
Received referral for initial lung cancer screening scan. Contacted patient and obtained smoking history,current smoker, 1 ppd x 45 yrs, as well as answering questions related to screening process. Patient denies signs of lung cancer such as weight loss or hemoptysis. Patient denies comorbidity that would prevent curative treatment if lung cancer were found. Patient is requesting a mid to late afternoon appointment the 1st week of August, because he is in the process of planning his July wedding right now. I will check with scheduling and call him back with an appointment. SDMV already done by PCP.

## 2020-10-28 ENCOUNTER — Other Ambulatory Visit: Payer: Self-pay | Admitting: *Deleted

## 2020-10-28 DIAGNOSIS — Z87891 Personal history of nicotine dependence: Secondary | ICD-10-CM

## 2020-10-28 DIAGNOSIS — Z122 Encounter for screening for malignant neoplasm of respiratory organs: Secondary | ICD-10-CM

## 2020-10-28 DIAGNOSIS — F17200 Nicotine dependence, unspecified, uncomplicated: Secondary | ICD-10-CM

## 2020-10-28 NOTE — Telephone Encounter (Signed)
Order is in. Let me know the date and time, so I can call patient and set up the [facility]. Thanks!

## 2020-11-05 ENCOUNTER — Telehealth: Payer: Self-pay | Admitting: *Deleted

## 2020-11-05 NOTE — Telephone Encounter (Signed)
Called patient and gave him the appointment details, per [facility], for his initial lung screening CT Scan, 12/25/20 @ 2:30 pm, [facility].  Patient verbalized understanding.

## 2020-12-25 ENCOUNTER — Ambulatory Visit
Admission: RE | Admit: 2020-12-25 | Discharge: 2020-12-25 | Disposition: A | Discharge status: Home / Self Care | Source: Ambulatory Visit | Attending: Oncology | Admitting: Oncology

## 2020-12-25 ENCOUNTER — Other Ambulatory Visit: Payer: Self-pay

## 2020-12-25 DIAGNOSIS — Z122 Encounter for screening for malignant neoplasm of respiratory organs: Secondary | ICD-10-CM | POA: Insufficient documentation

## 2020-12-25 DIAGNOSIS — Z87891 Personal history of nicotine dependence: Secondary | ICD-10-CM | POA: Diagnosis not present

## 2020-12-25 DIAGNOSIS — F17200 Nicotine dependence, unspecified, uncomplicated: Secondary | ICD-10-CM | POA: Diagnosis present

## 2021-04-29 ENCOUNTER — Other Ambulatory Visit: Payer: Self-pay | Admitting: *Deleted

## 2021-04-29 DIAGNOSIS — Z87891 Personal history of nicotine dependence: Secondary | ICD-10-CM

## 2021-04-29 DIAGNOSIS — F17210 Nicotine dependence, cigarettes, uncomplicated: Secondary | ICD-10-CM

## 2021-12-25 ENCOUNTER — Ambulatory Visit: Admission: RE | Admit: 2021-12-25 | Source: Ambulatory Visit

## 2023-02-10 IMAGING — CT CT CHEST LUNG CANCER SCREENING LOW DOSE W/O CM
2 of 5 series · 14 of 40 positions shown, 17 images · non-contrast
Comparison: Chest CT 04/30/2018.

CLINICAL DATA: 65-year-old male current smoker with 45 pack-year
history of smoking. Lung cancer screening examination.

EXAM:
CT CHEST WITHOUT CONTRAST LOW-DOSE FOR LUNG CANCER SCREENING
TECHNIQUE: Multidetector CT imaging of the chest was performed following the
standard protocol without IV contrast.

[Series 3: lung 1.00 · axial · 0.67mm/px · z∈[-1197,-917]mm · 11 of 310 slices shown, 14 images]
[im 15/310  mediastinal]
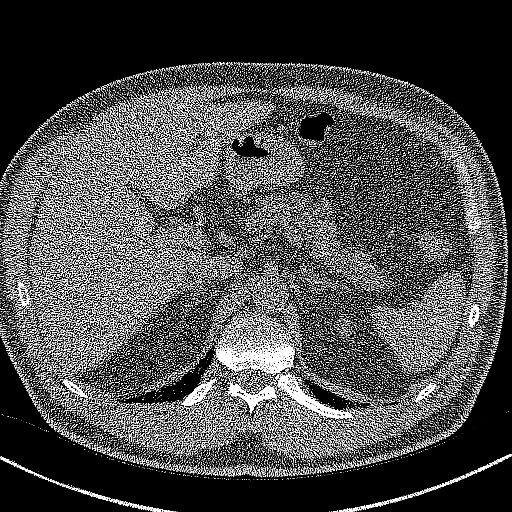
[im 15/310  lung]
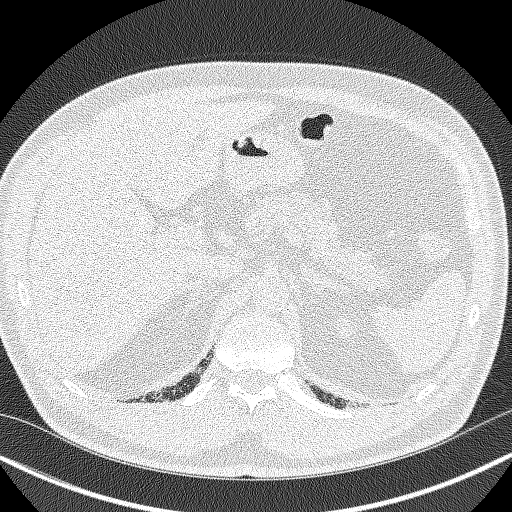
[im 43/310  lung]
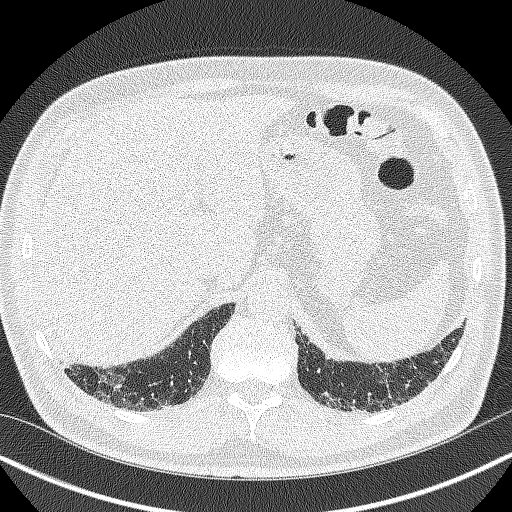
[im 71/310  lung]
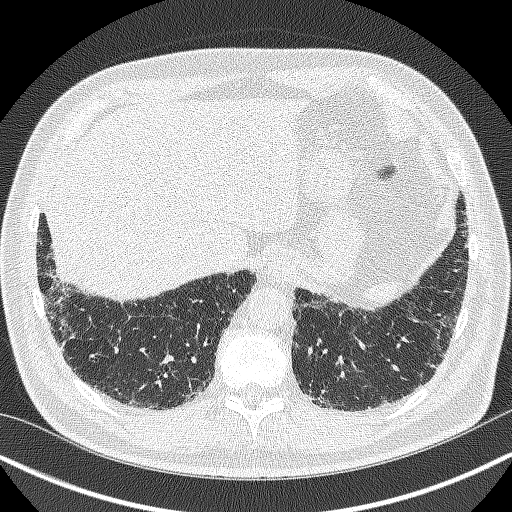
[im 99/310  lung]
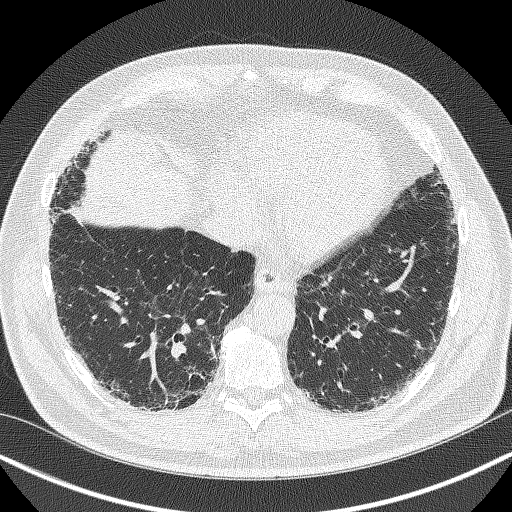
[im 127/310  mediastinal]
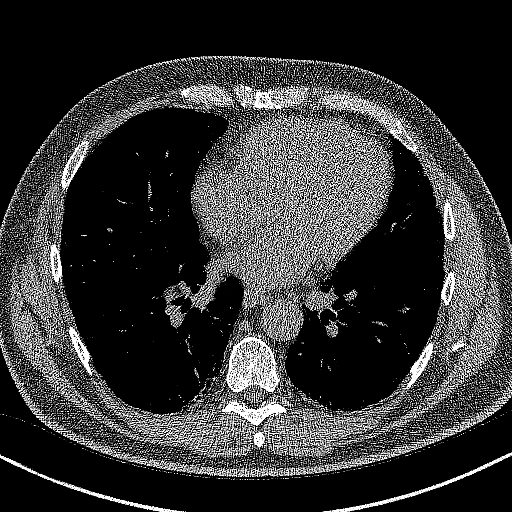
[im 127/310  lung]
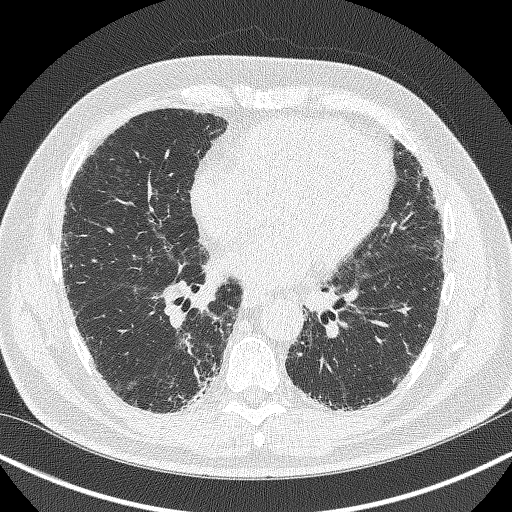
[im 155/310  lung]
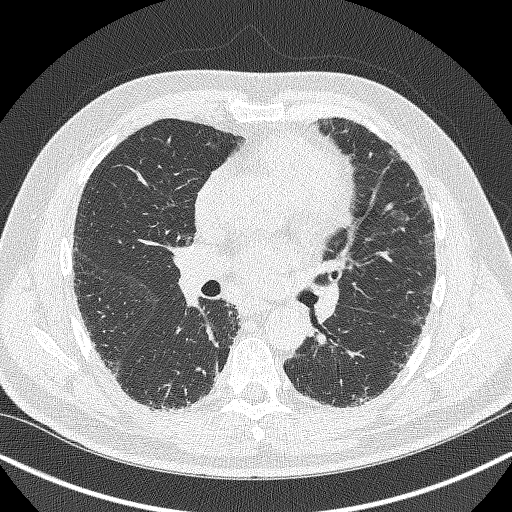
[im 183/310  lung]
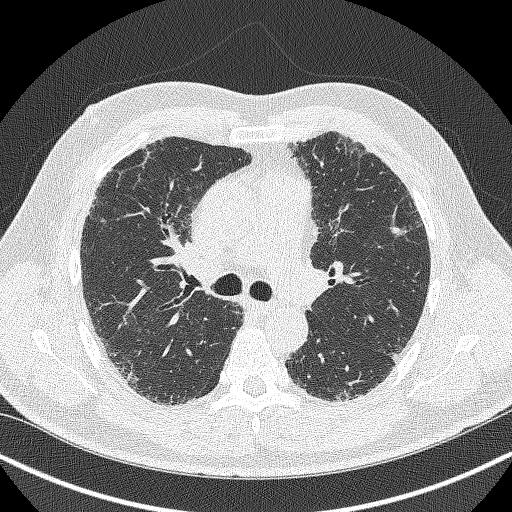
[im 211/310  lung]
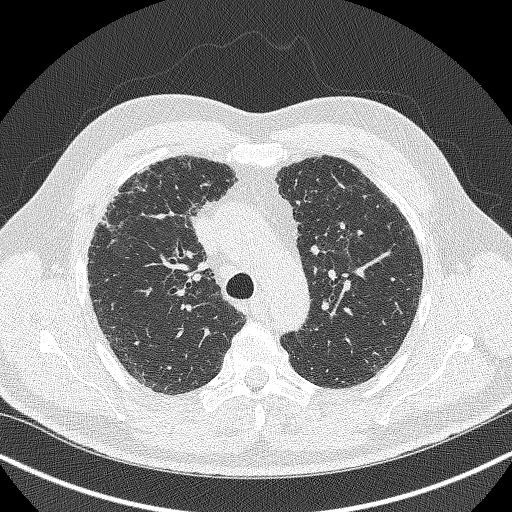
[im 239/310  mediastinal]
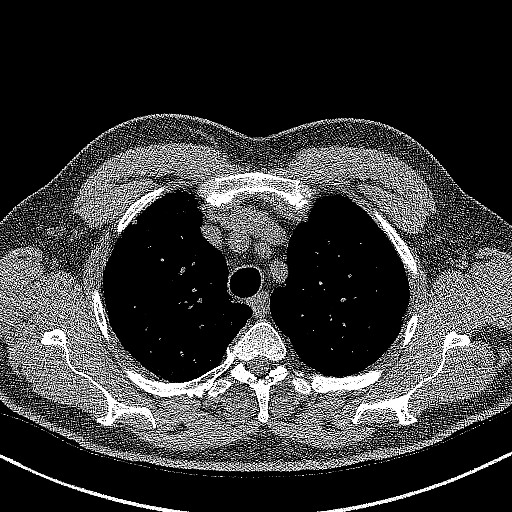
[im 239/310  lung]
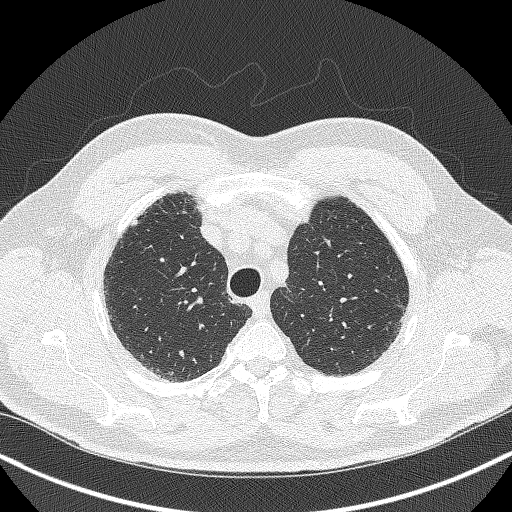
[im 267/310  lung]
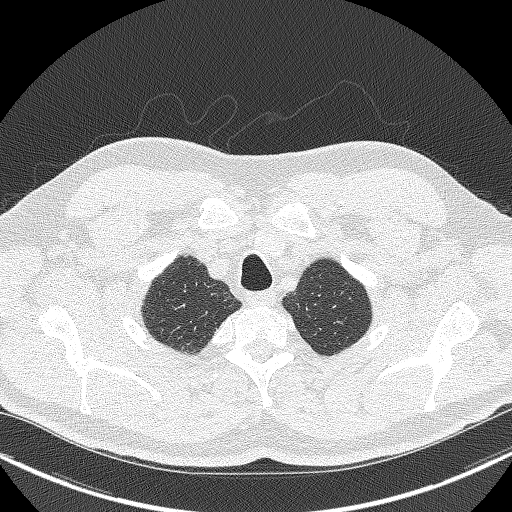
[im 295/310  lung]
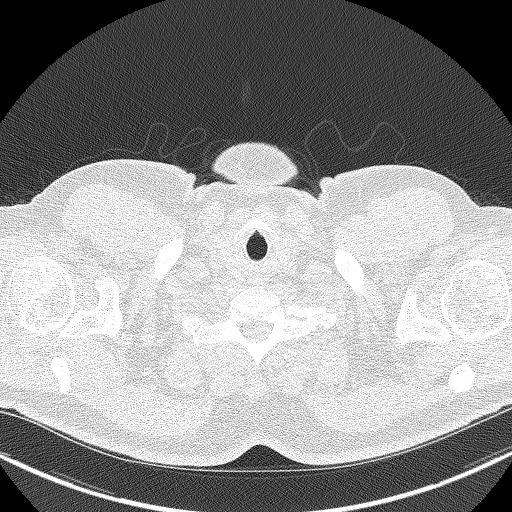

[Series 5: coronals lung 1.00 cor · coronal · 0.61mm/px · 3 of 299 slices shown]
[im 60/299  lung]
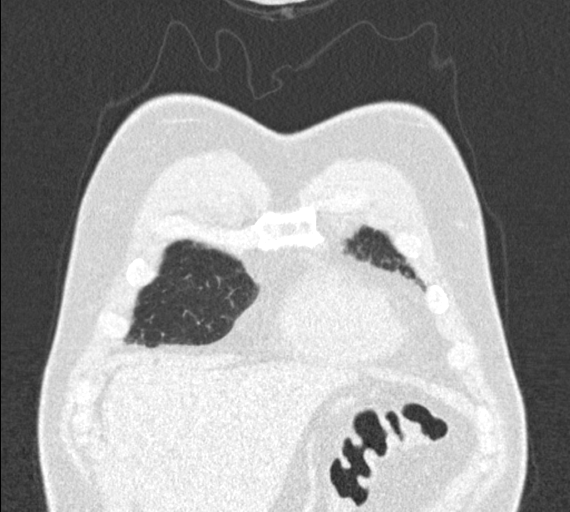
[im 120/299  lung]
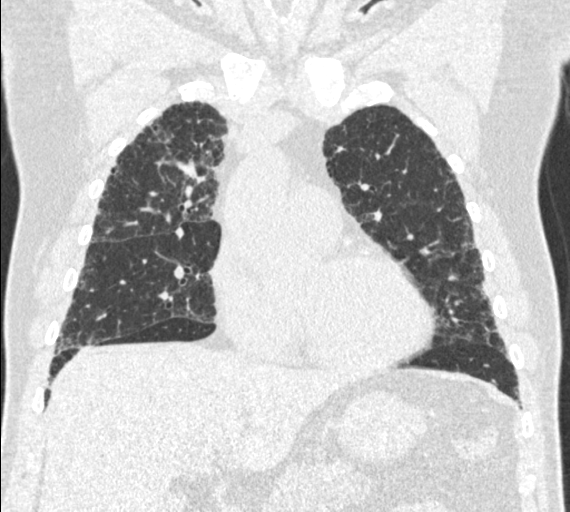
[im 179/299  lung]
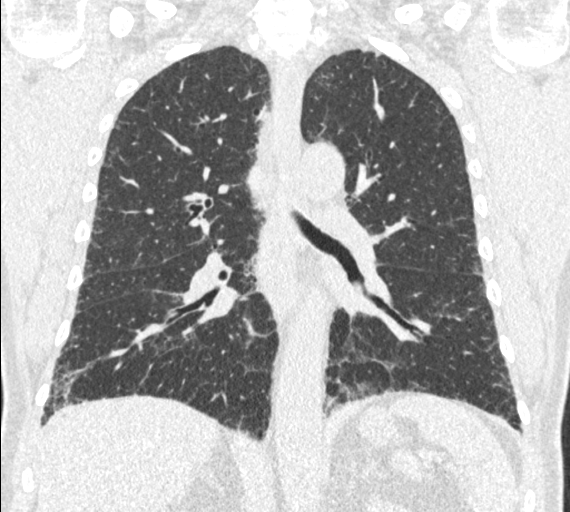

[14 of 40 positions shown; findings below may reference images not displayed]

FINDINGS: Cardiovascular: Heart size is normal. There is no significant
pericardial fluid, thickening or pericardial calcification. There is
aortic atherosclerosis, as well as atherosclerosis of the great
vessels of the mediastinum and the coronary arteries, including
calcified atherosclerotic plaque in the left anterior descending and
right coronary arteries.

Mediastinum/Nodes: No pathologically enlarged mediastinal or hilar
lymph nodes. Please note that accurate exclusion of hilar adenopathy
is limited on noncontrast CT scans. Esophagus is unremarkable in
appearance. No axillary lymphadenopathy.

Lungs/Pleura: Several tiny pulmonary nodules are noted throughout
the lungs bilaterally, largest of which is in the periphery of the
right upper lobe (axial image 116 of series 3), with a volume
derived mean diameter 5.2 mm. No larger more suspicious appearing
pulmonary nodules or masses are noted. No acute consolidative
airspace disease. No pleural effusions. Diffuse bronchial wall
thickening with mild centrilobular and paraseptal emphysema. In
addition, there are widespread but patchy areas of ground-glass
attenuation, septal thickening, subpleural reticulation, mild
cylindrical bronchiectasis and peripheral bronchiolectasis,
concerning for interstitial lung disease. These findings have a mild
craniocaudal gradient and appear minimally increased compared to
prior chest CT from 04/30/2018. No frank honeycombing.

Upper Abdomen: Unremarkable.

Musculoskeletal: There are no aggressive appearing lytic or blastic
lesions noted in the visualized portions of the skeleton.
IMPRESSION: 1. Lung-RADS 2S, benign appearance or behavior. Continue annual
screening with low-dose chest CT without contrast in 12 months.
2. The "S" modifier above refers to potentially clinically
significant non lung cancer related findings. Specifically, there is
evidence of interstitial lung disease, with a spectrum of findings
categorized as probable usual interstitial pneumonia (UIP) per
current ATS guidelines. Outpatient referral to Pulmonology for
further clinical evaluation is recommended.
3. There is aortic atherosclerosis, as well as atherosclerosis of
the great vessels of the mediastinum and the coronary arteries,
including calcified atherosclerotic plaque in the 2 vessel coronary
arteries.
4. Mild diffuse bronchial wall thickening with mild centrilobular
and paraseptal emphysema; imaging findings suggestive of underlying
COPD.

Aortic Atherosclerosis (J0GRA-90S.S) and Emphysema (J0GRA-9OT.5).
